# Patient Record
Sex: Female | Born: 1995 | Race: White | Hispanic: No | Marital: Married | Smoking: Never smoker
Health system: Southern US, Community
[De-identification: ages and names within clinical notes are randomized; demographics above are authoritative.]

## PROBLEM LIST (undated history)

## (undated) DIAGNOSIS — Z87442 Personal history of urinary calculi: Secondary | ICD-10-CM

## (undated) DIAGNOSIS — F419 Anxiety disorder, unspecified: Secondary | ICD-10-CM

## (undated) DIAGNOSIS — R519 Headache, unspecified: Secondary | ICD-10-CM

## (undated) DIAGNOSIS — I499 Cardiac arrhythmia, unspecified: Secondary | ICD-10-CM

## (undated) DIAGNOSIS — E7800 Pure hypercholesterolemia, unspecified: Secondary | ICD-10-CM

## (undated) DIAGNOSIS — J45909 Unspecified asthma, uncomplicated: Secondary | ICD-10-CM

## (undated) DIAGNOSIS — Z789 Other specified health status: Secondary | ICD-10-CM

## (undated) HISTORY — PX: ADENOIDECTOMY: SUR15

## (undated) HISTORY — PX: OTHER SURGICAL HISTORY: SHX169

---

## 2020-01-26 ENCOUNTER — Encounter (HOSPITAL_COMMUNITY): Admission: EM | Disposition: A | Discharge status: Home / Self Care | Payer: Self-pay | Source: Home / Self Care

## 2020-01-26 ENCOUNTER — Emergency Department (HOSPITAL_COMMUNITY): Payer: Medicaid Other

## 2020-01-26 ENCOUNTER — Inpatient Hospital Stay (HOSPITAL_COMMUNITY): Payer: Medicaid Other

## 2020-01-26 ENCOUNTER — Inpatient Hospital Stay (HOSPITAL_COMMUNITY)
Admission: EM | Admit: 2020-01-26 | Discharge: 2020-01-30 | DRG: 504 | Disposition: A | Discharge status: Home / Self Care | Payer: Medicaid Other | Attending: General Surgery | Admitting: General Surgery

## 2020-01-26 ENCOUNTER — Encounter (HOSPITAL_COMMUNITY): Payer: Self-pay

## 2020-01-26 ENCOUNTER — Inpatient Hospital Stay (HOSPITAL_COMMUNITY): Payer: Medicaid Other | Admitting: Certified Registered"

## 2020-01-26 DIAGNOSIS — S92009A Unspecified fracture of unspecified calcaneus, initial encounter for closed fracture: Secondary | ICD-10-CM | POA: Diagnosis present

## 2020-01-26 DIAGNOSIS — S01112A Laceration without foreign body of left eyelid and periocular area, initial encounter: Secondary | ICD-10-CM | POA: Diagnosis present

## 2020-01-26 DIAGNOSIS — R402142 Coma scale, eyes open, spontaneous, at arrival to emergency department: Secondary | ICD-10-CM | POA: Diagnosis present

## 2020-01-26 DIAGNOSIS — S0081XA Abrasion of other part of head, initial encounter: Secondary | ICD-10-CM | POA: Diagnosis present

## 2020-01-26 DIAGNOSIS — D62 Acute posthemorrhagic anemia: Secondary | ICD-10-CM | POA: Diagnosis not present

## 2020-01-26 DIAGNOSIS — S060X1A Concussion with loss of consciousness of 30 minutes or less, initial encounter: Secondary | ICD-10-CM | POA: Diagnosis present

## 2020-01-26 DIAGNOSIS — Z20822 Contact with and (suspected) exposure to covid-19: Secondary | ICD-10-CM | POA: Diagnosis present

## 2020-01-26 DIAGNOSIS — E7800 Pure hypercholesterolemia, unspecified: Secondary | ICD-10-CM | POA: Diagnosis present

## 2020-01-26 DIAGNOSIS — R402242 Coma scale, best verbal response, confused conversation, at arrival to emergency department: Secondary | ICD-10-CM | POA: Diagnosis present

## 2020-01-26 DIAGNOSIS — R413 Other amnesia: Secondary | ICD-10-CM | POA: Diagnosis present

## 2020-01-26 DIAGNOSIS — S92061B Displaced intraarticular fracture of right calcaneus, initial encounter for open fracture: Secondary | ICD-10-CM | POA: Diagnosis present

## 2020-01-26 DIAGNOSIS — F17290 Nicotine dependence, other tobacco product, uncomplicated: Secondary | ICD-10-CM | POA: Diagnosis present

## 2020-01-26 DIAGNOSIS — T148XXA Other injury of unspecified body region, initial encounter: Secondary | ICD-10-CM

## 2020-01-26 DIAGNOSIS — Z419 Encounter for procedure for purposes other than remedying health state, unspecified: Secondary | ICD-10-CM

## 2020-01-26 DIAGNOSIS — S92121B Displaced fracture of body of right talus, initial encounter for open fracture: Secondary | ICD-10-CM | POA: Diagnosis present

## 2020-01-26 DIAGNOSIS — R402362 Coma scale, best motor response, obeys commands, at arrival to emergency department: Secondary | ICD-10-CM | POA: Diagnosis present

## 2020-01-26 DIAGNOSIS — Z23 Encounter for immunization: Secondary | ICD-10-CM

## 2020-01-26 DIAGNOSIS — S92012B Displaced fracture of body of left calcaneus, initial encounter for open fracture: Secondary | ICD-10-CM

## 2020-01-26 DIAGNOSIS — Y92410 Unspecified street and highway as the place of occurrence of the external cause: Secondary | ICD-10-CM | POA: Diagnosis not present

## 2020-01-26 HISTORY — PX: ORIF CALCANEOUS FRACTURE: SHX5030

## 2020-01-26 HISTORY — PX: I & D EXTREMITY: SHX5045

## 2020-01-26 HISTORY — DX: Pure hypercholesterolemia, unspecified: E78.00

## 2020-01-26 HISTORY — DX: Other specified health status: Z78.9

## 2020-01-26 LAB — COMPREHENSIVE METABOLIC PANEL
ALT: 23 U/L (ref 0–44)
AST: 23 U/L (ref 15–41)
Albumin: 3.9 g/dL (ref 3.5–5.0)
Alkaline Phosphatase: 69 U/L (ref 38–126)
Anion gap: 13 (ref 5–15)
BUN: 9 mg/dL (ref 6–20)
CO2: 22 mmol/L (ref 22–32)
Calcium: 8.5 mg/dL — ABNORMAL LOW (ref 8.9–10.3)
Chloride: 103 mmol/L (ref 98–111)
Creatinine, Ser: 0.83 mg/dL (ref 0.44–1.00)
GFR calc Af Amer: >60 mL/min (ref >60)
GFR calc non Af Amer: >60 mL/min (ref >60)
Glucose, Bld: 163 mg/dL — ABNORMAL HIGH (ref 70–99)
Potassium: 3 mmol/L — ABNORMAL LOW (ref 3.5–5.1)
Sodium: 138 mmol/L (ref 135–145)
Total Bilirubin: 0.3 mg/dL (ref 0.3–1.2)
Total Protein: 6.9 g/dL (ref 6.5–8.1)

## 2020-01-26 LAB — I-STAT CHEM 8, ED
BUN: 8 mg/dL (ref 6–20)
Calcium, Ion: 1.13 mmol/L — ABNORMAL LOW (ref 1.15–1.40)
Chloride: 105 mmol/L (ref 98–111)
Creatinine, Ser: 0.6 mg/dL (ref 0.44–1.00)
Glucose, Bld: 159 mg/dL — ABNORMAL HIGH (ref 70–99)
HCT: 40 % (ref 36.0–46.0)
Hemoglobin: 13.6 g/dL (ref 12.0–15.0)
Potassium: 3 mmol/L — ABNORMAL LOW (ref 3.5–5.1)
Sodium: 141 mmol/L (ref 135–145)
TCO2: 23 mmol/L (ref 22–32)

## 2020-01-26 LAB — URINALYSIS, ROUTINE W REFLEX MICROSCOPIC
Bilirubin Urine: NEGATIVE
Glucose, UA: NEGATIVE mg/dL
Ketones, ur: NEGATIVE mg/dL
Nitrite: NEGATIVE
Protein, ur: NEGATIVE mg/dL
RBC / HPF: >50 RBC/hpf — ABNORMAL HIGH (ref 0–5)
Specific Gravity, Urine: 1.02 (ref 1.005–1.030)
pH: 6 (ref 5.0–8.0)

## 2020-01-26 LAB — SAMPLE TO BLOOD BANK

## 2020-01-26 LAB — CBC
HCT: 40.6 % (ref 36.0–46.0)
Hemoglobin: 13.5 g/dL (ref 12.0–15.0)
MCH: 30.9 pg (ref 26.0–34.0)
MCHC: 33.3 g/dL (ref 30.0–36.0)
MCV: 92.9 fL (ref 80.0–100.0)
Platelets: 328 10*3/uL (ref 150–400)
RBC: 4.37 MIL/uL (ref 3.87–5.11)
RDW: 13 % (ref 11.5–15.5)
WBC: 15.4 10*3/uL — ABNORMAL HIGH (ref 4.0–10.5)
nRBC: 0 % (ref 0.0–0.2)

## 2020-01-26 LAB — ETHANOL: Alcohol, Ethyl (B): <10 mg/dL (ref <10)

## 2020-01-26 LAB — RESPIRATORY PANEL BY RT PCR (FLU A&B, COVID)
Influenza A by PCR: NEGATIVE
Influenza B by PCR: NEGATIVE
SARS Coronavirus 2 by RT PCR: NEGATIVE

## 2020-01-26 LAB — I-STAT BETA HCG BLOOD, ED (MC, WL, AP ONLY): I-stat hCG, quantitative: <5 m[IU]/mL (ref <5)

## 2020-01-26 LAB — PROTIME-INR
INR: 0.9 (ref 0.8–1.2)
Prothrombin Time: 12.4 seconds (ref 11.4–15.2)

## 2020-01-26 LAB — CDS SEROLOGY

## 2020-01-26 LAB — LACTIC ACID, PLASMA: Lactic Acid, Venous: 2.9 mmol/L (ref 0.5–1.9)

## 2020-01-26 SURGERY — OPEN REDUCTION INTERNAL FIXATION (ORIF) CALCANEOUS FRACTURE
Anesthesia: General | Site: Foot | Laterality: Right

## 2020-01-26 MED ORDER — HYDROMORPHONE HCL 1 MG/ML IJ SOLN
0.2500 mg | INTRAMUSCULAR | Status: DC | PRN
  Administered 2020-01-26 (×3): 0.5 mg via INTRAVENOUS

## 2020-01-26 MED ORDER — FENTANYL CITRATE (PF) 100 MCG/2ML IJ SOLN
50.0000 ug | Freq: Once | INTRAMUSCULAR | Status: AC
  Administered 2020-01-26: 10:00:00 50 ug via INTRAVENOUS
  Filled 2020-01-26: qty 2

## 2020-01-26 MED ORDER — HYDROMORPHONE HCL 1 MG/ML IJ SOLN
INTRAMUSCULAR | Status: AC
  Filled 2020-01-26: qty 1

## 2020-01-26 MED ORDER — ROCURONIUM BROMIDE 10 MG/ML (PF) SYRINGE
PREFILLED_SYRINGE | INTRAVENOUS | Status: AC
  Filled 2020-01-26: qty 10

## 2020-01-26 MED ORDER — FENTANYL CITRATE (PF) 250 MCG/5ML IJ SOLN
INTRAMUSCULAR | Status: AC
  Filled 2020-01-26: qty 5

## 2020-01-26 MED ORDER — FENTANYL CITRATE (PF) 250 MCG/5ML IJ SOLN
INTRAMUSCULAR | Status: DC | PRN
  Administered 2020-01-26 (×2): 50 ug via INTRAVENOUS
  Administered 2020-01-26: 100 ug via INTRAVENOUS
  Administered 2020-01-26: 50 ug via INTRAVENOUS

## 2020-01-26 MED ORDER — DOCUSATE SODIUM 100 MG PO CAPS
100.0000 mg | ORAL_CAPSULE | Freq: Two times a day (BID) | ORAL | Status: DC
  Administered 2020-01-26 – 2020-01-30 (×8): 100 mg via ORAL
  Filled 2020-01-26 (×7): qty 1

## 2020-01-26 MED ORDER — CEFAZOLIN SODIUM-DEXTROSE 2-4 GM/100ML-% IV SOLN
2.0000 g | Freq: Once | INTRAVENOUS | Status: AC
  Administered 2020-01-26: 2 g via INTRAVENOUS
  Filled 2020-01-26: qty 100

## 2020-01-26 MED ORDER — TETRACAINE HCL 0.5 % OP SOLN
2.0000 [drp] | Freq: Once | OPHTHALMIC | Status: AC
  Administered 2020-01-26: 11:00:00 2 [drp] via OPHTHALMIC
  Filled 2020-01-26: qty 8

## 2020-01-26 MED ORDER — MIDAZOLAM HCL 2 MG/2ML IJ SOLN
INTRAMUSCULAR | Status: AC
  Filled 2020-01-26: qty 2

## 2020-01-26 MED ORDER — SUGAMMADEX SODIUM 200 MG/2ML IV SOLN
INTRAVENOUS | Status: DC | PRN
  Administered 2020-01-26: 200 mg via INTRAVENOUS

## 2020-01-26 MED ORDER — 0.9 % SODIUM CHLORIDE (POUR BTL) OPTIME
TOPICAL | Status: DC | PRN
  Administered 2020-01-26: 1000 mL

## 2020-01-26 MED ORDER — GABAPENTIN 300 MG PO CAPS
300.0000 mg | ORAL_CAPSULE | Freq: Three times a day (TID) | ORAL | Status: DC
  Administered 2020-01-26 – 2020-01-28 (×5): 300 mg via ORAL
  Filled 2020-01-26 (×5): qty 1

## 2020-01-26 MED ORDER — HYDROMORPHONE HCL 1 MG/ML IJ SOLN
INTRAMUSCULAR | Status: DC | PRN
  Administered 2020-01-26: .5 mg via INTRAVENOUS

## 2020-01-26 MED ORDER — POVIDONE-IODINE 10 % EX SWAB: 2.0000 "application " | Freq: Once | CUTANEOUS | Status: DC

## 2020-01-26 MED ORDER — PHENYLEPHRINE 40 MCG/ML (10ML) SYRINGE FOR IV PUSH (FOR BLOOD PRESSURE SUPPORT)
PREFILLED_SYRINGE | INTRAVENOUS | Status: AC
  Filled 2020-01-26: qty 20

## 2020-01-26 MED ORDER — MIDAZOLAM HCL 5 MG/5ML IJ SOLN
INTRAMUSCULAR | Status: DC | PRN
  Administered 2020-01-26: 2 mg via INTRAVENOUS

## 2020-01-26 MED ORDER — VANCOMYCIN HCL 1000 MG IV SOLR
INTRAVENOUS | Status: DC | PRN
  Administered 2020-01-26: 1000 mg via TOPICAL

## 2020-01-26 MED ORDER — SODIUM CHLORIDE 0.9 % IV SOLN
2.0000 g | INTRAVENOUS | Status: AC
  Administered 2020-01-26 – 2020-01-28 (×3): 2 g via INTRAVENOUS
  Filled 2020-01-26 (×3): qty 20

## 2020-01-26 MED ORDER — FENTANYL CITRATE (PF) 100 MCG/2ML IJ SOLN
50.0000 ug | Freq: Once | INTRAMUSCULAR | Status: AC
  Administered 2020-01-26: 50 ug via INTRAVENOUS
  Filled 2020-01-26: qty 2

## 2020-01-26 MED ORDER — CHLORHEXIDINE GLUCONATE 4 % EX LIQD
60.0000 mL | Freq: Once | CUTANEOUS | Status: DC
  Filled 2020-01-26: qty 60

## 2020-01-26 MED ORDER — SODIUM CHLORIDE 0.9 % IR SOLN
Status: DC | PRN
  Administered 2020-01-26 (×3): 3000 mL

## 2020-01-26 MED ORDER — PROPOFOL 10 MG/ML IV BOLUS
INTRAVENOUS | Status: DC | PRN
  Administered 2020-01-26: 200 mg via INTRAVENOUS

## 2020-01-26 MED ORDER — LACTATED RINGERS IV SOLN: INTRAVENOUS | Status: DC

## 2020-01-26 MED ORDER — CEFAZOLIN SODIUM-DEXTROSE 2-4 GM/100ML-% IV SOLN
2.0000 g | INTRAVENOUS | Status: AC
  Administered 2020-01-26: 2 g via INTRAVENOUS
  Filled 2020-01-26: qty 100

## 2020-01-26 MED ORDER — LIDOCAINE 2% (20 MG/ML) 5 ML SYRINGE
INTRAMUSCULAR | Status: AC
  Filled 2020-01-26: qty 5

## 2020-01-26 MED ORDER — ACETAMINOPHEN 500 MG PO TABS: 1000.0000 mg | ORAL_TABLET | Freq: Once | ORAL | Status: AC

## 2020-01-26 MED ORDER — ACETAMINOPHEN 500 MG PO TABS
ORAL_TABLET | ORAL | Status: AC
  Administered 2020-01-26: 1000 mg via ORAL
  Filled 2020-01-26: qty 2

## 2020-01-26 MED ORDER — METHOCARBAMOL 500 MG PO TABS
1000.0000 mg | ORAL_TABLET | Freq: Three times a day (TID) | ORAL | Status: DC
  Administered 2020-01-26 – 2020-01-30 (×11): 1000 mg via ORAL
  Filled 2020-01-26 (×12): qty 2

## 2020-01-26 MED ORDER — TOBRAMYCIN SULFATE 1.2 G IJ SOLR
INTRAMUSCULAR | Status: DC | PRN
  Administered 2020-01-26: 1.2 g

## 2020-01-26 MED ORDER — HYDROMORPHONE HCL 1 MG/ML IJ SOLN
INTRAMUSCULAR | Status: AC
  Filled 2020-01-26: qty 0.5

## 2020-01-26 MED ORDER — ENOXAPARIN SODIUM 40 MG/0.4ML ~~LOC~~ SOLN
40.0000 mg | SUBCUTANEOUS | Status: DC
  Administered 2020-01-27 – 2020-01-30 (×4): 40 mg via SUBCUTANEOUS
  Filled 2020-01-26 (×4): qty 0.4

## 2020-01-26 MED ORDER — LIDOCAINE 2% (20 MG/ML) 5 ML SYRINGE
INTRAMUSCULAR | Status: DC | PRN
  Administered 2020-01-26: 60 mg via INTRAVENOUS

## 2020-01-26 MED ORDER — ENOXAPARIN SODIUM 30 MG/0.3ML ~~LOC~~ SOLN: 30.0000 mg | Freq: Two times a day (BID) | SUBCUTANEOUS | Status: DC

## 2020-01-26 MED ORDER — PROPOFOL 10 MG/ML IV BOLUS
INTRAVENOUS | Status: AC
  Filled 2020-01-26: qty 20

## 2020-01-26 MED ORDER — DEXAMETHASONE SODIUM PHOSPHATE 10 MG/ML IJ SOLN
INTRAMUSCULAR | Status: DC | PRN
  Administered 2020-01-26: 5 mg via INTRAVENOUS

## 2020-01-26 MED ORDER — ONDANSETRON 4 MG PO TBDP: 4.0000 mg | ORAL_TABLET | Freq: Four times a day (QID) | ORAL | Status: DC | PRN

## 2020-01-26 MED ORDER — ASCORBIC ACID 500 MG PO TABS
500.0000 mg | ORAL_TABLET | Freq: Every day | ORAL | Status: DC
  Administered 2020-01-26 – 2020-01-30 (×5): 500 mg via ORAL
  Filled 2020-01-26 (×5): qty 1

## 2020-01-26 MED ORDER — PHENYLEPHRINE HCL (PRESSORS) 10 MG/ML IV SOLN
INTRAVENOUS | Status: AC
  Filled 2020-01-26: qty 1

## 2020-01-26 MED ORDER — VANCOMYCIN HCL 1000 MG IV SOLR
INTRAVENOUS | Status: AC
  Filled 2020-01-26: qty 1000

## 2020-01-26 MED ORDER — DEXAMETHASONE SODIUM PHOSPHATE 10 MG/ML IJ SOLN
INTRAMUSCULAR | Status: AC
  Filled 2020-01-26: qty 1

## 2020-01-26 MED ORDER — IOHEXOL 300 MG/ML  SOLN
100.0000 mL | Freq: Once | INTRAMUSCULAR | Status: AC | PRN
  Administered 2020-01-26: 12:00:00 100 mL via INTRAVENOUS

## 2020-01-26 MED ORDER — OXYCODONE HCL 5 MG/5ML PO SOLN
5.0000 mg | ORAL | Status: DC | PRN
  Administered 2020-01-26 – 2020-01-27 (×5): 10 mg via ORAL
  Filled 2020-01-26 (×5): qty 10

## 2020-01-26 MED ORDER — ONDANSETRON HCL 4 MG/2ML IJ SOLN
4.0000 mg | Freq: Four times a day (QID) | INTRAMUSCULAR | Status: DC | PRN
  Administered 2020-01-26 – 2020-01-30 (×2): 4 mg via INTRAVENOUS
  Filled 2020-01-26 (×2): qty 2

## 2020-01-26 MED ORDER — FLUORESCEIN SODIUM 1 MG OP STRP
1.0000 | ORAL_STRIP | Freq: Once | OPHTHALMIC | Status: AC
  Administered 2020-01-26: 1 via OPHTHALMIC
  Filled 2020-01-26: qty 1

## 2020-01-26 MED ORDER — ONDANSETRON HCL 4 MG/2ML IJ SOLN
4.0000 mg | Freq: Once | INTRAMUSCULAR | Status: AC
  Administered 2020-01-26: 10:00:00 4 mg via INTRAVENOUS
  Filled 2020-01-26: qty 2

## 2020-01-26 MED ORDER — TOBRAMYCIN SULFATE 1.2 G IJ SOLR
INTRAMUSCULAR | Status: AC
  Filled 2020-01-26: qty 1.2

## 2020-01-26 MED ORDER — MORPHINE SULFATE (PF) 2 MG/ML IV SOLN
2.0000 mg | INTRAVENOUS | Status: DC | PRN
  Administered 2020-01-26 – 2020-01-27 (×2): 2 mg via INTRAVENOUS
  Filled 2020-01-26 (×2): qty 1

## 2020-01-26 MED ORDER — TETANUS-DIPHTH-ACELL PERTUSSIS 5-2.5-18.5 LF-MCG/0.5 IM SUSP
0.5000 mL | Freq: Once | INTRAMUSCULAR | Status: AC
  Administered 2020-01-26: 10:00:00 0.5 mL via INTRAMUSCULAR
  Filled 2020-01-26: qty 0.5

## 2020-01-26 MED ORDER — ACETAMINOPHEN 500 MG PO TABS
1000.0000 mg | ORAL_TABLET | Freq: Four times a day (QID) | ORAL | Status: DC
  Administered 2020-01-26 – 2020-01-30 (×14): 1000 mg via ORAL
  Filled 2020-01-26 (×14): qty 2

## 2020-01-26 MED ORDER — ROCURONIUM BROMIDE 50 MG/5ML IV SOSY
PREFILLED_SYRINGE | INTRAVENOUS | Status: DC | PRN
  Administered 2020-01-26: 80 mg via INTRAVENOUS

## 2020-01-26 MED ORDER — MORPHINE SULFATE (PF) 2 MG/ML IV SOLN: 2.0000 mg | INTRAVENOUS | Status: DC | PRN

## 2020-01-26 MED ORDER — ONDANSETRON HCL 4 MG/2ML IJ SOLN
INTRAMUSCULAR | Status: AC
  Filled 2020-01-26: qty 2

## 2020-01-26 MED ORDER — SODIUM CHLORIDE 0.9 % IV BOLUS
125.0000 mL | Freq: Once | INTRAVENOUS | Status: AC
  Administered 2020-01-26: 125 mL via INTRAVENOUS

## 2020-01-26 SURGICAL SUPPLY — 69 items
BIT DRILL 4.8X200 CANN (BIT) ×3 IMPLANT
BNDG COHESIVE 4X5 TAN STRL (GAUZE/BANDAGES/DRESSINGS) ×3 IMPLANT
BNDG ELASTIC 4X5.8 VLCR STR LF (GAUZE/BANDAGES/DRESSINGS) ×3 IMPLANT
BNDG ELASTIC 6X10 VLCR STRL LF (GAUZE/BANDAGES/DRESSINGS) ×3 IMPLANT
BNDG ELASTIC 6X5.8 VLCR STR LF (GAUZE/BANDAGES/DRESSINGS) ×3 IMPLANT
BNDG GAUZE ELAST 4 BULKY (GAUZE/BANDAGES/DRESSINGS) ×6 IMPLANT
BRUSH SCRUB EZ PLAIN DRY (MISCELLANEOUS) ×9 IMPLANT
CANISTER WOUNDNEG PRESSURE 500 (CANNISTER) ×3 IMPLANT
CHLORAPREP W/TINT 26 (MISCELLANEOUS) ×6 IMPLANT
COVER MAYO STAND STRL (DRAPES) IMPLANT
COVER SURGICAL LIGHT HANDLE (MISCELLANEOUS) ×6 IMPLANT
COVER WAND RF STERILE (DRAPES) ×3 IMPLANT
DRAPE C-ARM 42X72 X-RAY (DRAPES) ×3 IMPLANT
DRAPE C-ARMOR (DRAPES) ×3 IMPLANT
DRAPE IMP U-DRAPE 54X76 (DRAPES) ×6 IMPLANT
DRAPE ORTHO SPLIT 77X108 STRL (DRAPES) ×1
DRAPE SURG 17X23 STRL (DRAPES) ×3 IMPLANT
DRAPE SURG ORHT 6 SPLT 77X108 (DRAPES) ×2 IMPLANT
DRAPE U-SHAPE 47X51 STRL (DRAPES) ×3 IMPLANT
DRSG ADAPTIC 3X8 NADH LF (GAUZE/BANDAGES/DRESSINGS) IMPLANT
DRSG MEPITEL 4X7.2 (GAUZE/BANDAGES/DRESSINGS) IMPLANT
ELECT REM PT RETURN 9FT ADLT (ELECTROSURGICAL) ×3
ELECTRODE REM PT RTRN 9FT ADLT (ELECTROSURGICAL) ×2 IMPLANT
EVACUATOR 1/8 PVC DRAIN (DRAIN) IMPLANT
GAUZE SPONGE 4X4 12PLY STRL (GAUZE/BANDAGES/DRESSINGS) ×3 IMPLANT
GLOVE BIO SURGEON STRL SZ 6.5 (GLOVE) ×9 IMPLANT
GLOVE BIO SURGEON STRL SZ7.5 (GLOVE) ×30 IMPLANT
GLOVE BIOGEL M 6.5 STRL (GLOVE) ×15 IMPLANT
GLOVE BIOGEL PI IND STRL 6.5 (GLOVE) ×2 IMPLANT
GLOVE BIOGEL PI IND STRL 7.5 (GLOVE) ×2 IMPLANT
GLOVE BIOGEL PI IND STRL 8 (GLOVE) ×4 IMPLANT
GLOVE BIOGEL PI INDICATOR 6.5 (GLOVE) ×1
GLOVE BIOGEL PI INDICATOR 7.5 (GLOVE) ×1
GLOVE BIOGEL PI INDICATOR 8 (GLOVE) ×2
GOWN STRL REUS W/ TWL LRG LVL3 (GOWN DISPOSABLE) ×4 IMPLANT
GOWN STRL REUS W/TWL LRG LVL3 (GOWN DISPOSABLE) ×2
HANDPIECE INTERPULSE COAX TIP (DISPOSABLE) ×1
KIT BASIN OR (CUSTOM PROCEDURE TRAY) ×3 IMPLANT
KIT PREVENA INCISION MGT 13 (CANNISTER) ×3 IMPLANT
KIT TURNOVER KIT B (KITS) ×3 IMPLANT
MANIFOLD NEPTUNE II (INSTRUMENTS) ×3 IMPLANT
NEEDLE 22X1 1/2 (OR ONLY) (NEEDLE) IMPLANT
NS IRRIG 1000ML POUR BTL (IV SOLUTION) ×3 IMPLANT
PACK ORTHO EXTREMITY (CUSTOM PROCEDURE TRAY) ×3 IMPLANT
PAD ARMBOARD 7.5X6 YLW CONV (MISCELLANEOUS) ×6 IMPLANT
PAD CAST 4YDX4 CTTN HI CHSV (CAST SUPPLIES) ×2 IMPLANT
PADDING CAST COTTON 4X4 STRL (CAST SUPPLIES) ×1
PADDING CAST COTTON 6X4 STRL (CAST SUPPLIES) ×3 IMPLANT
PIN GUIDE DRILL TIP 2.8X300 (DRILL) ×6 IMPLANT
SCREW CANN  FULL THD 6.5X75 (Screw) ×1 IMPLANT
SCREW CANN FT 70X6.5 NS (Screw) ×2 IMPLANT
SCREW CANN FULL THD 6.5X75 (Screw) ×2 IMPLANT
SCREW CANNULATED 6.5X70MM (Screw) ×1 IMPLANT
SET HNDPC FAN SPRY TIP SCT (DISPOSABLE) ×2 IMPLANT
SPONGE LAP 18X18 RF (DISPOSABLE) ×3 IMPLANT
STAPLER VISISTAT 35W (STAPLE) ×3 IMPLANT
STRIP CLOSURE SKIN 1/2X4 (GAUZE/BANDAGES/DRESSINGS) IMPLANT
SUT ETHILON 2 0 FS 18 (SUTURE) ×3 IMPLANT
SUT ETHILON 3 0 PS 1 (SUTURE) ×6 IMPLANT
SUT MON AB 2-0 CT1 36 (SUTURE) ×3 IMPLANT
SUT PDS AB 0 CT 36 (SUTURE) IMPLANT
SUT PROLENE 0 CT (SUTURE) IMPLANT
SWAB CULTURE ESWAB REG 1ML (MISCELLANEOUS) IMPLANT
TOWEL GREEN STERILE (TOWEL DISPOSABLE) ×6 IMPLANT
TOWEL GREEN STERILE FF (TOWEL DISPOSABLE) ×6 IMPLANT
TUBE CONNECTING 12X1/4 (SUCTIONS) ×3 IMPLANT
UNDERPAD 30X30 (UNDERPADS AND DIAPERS) ×3 IMPLANT
WATER STERILE IRR 1000ML POUR (IV SOLUTION) ×3 IMPLANT
YANKAUER SUCT BULB TIP NO VENT (SUCTIONS) ×6 IMPLANT

## 2020-01-26 NOTE — ED Notes (Signed)
Mother, Herbert Seta, to be updated. Phone number [phone].
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
## 2020-01-26 NOTE — Transfer of Care (Signed)
Immediate Anesthesia Transfer of Care Note  Patient: Stephanie Dickson  Procedure(s) Performed: IRRIGATION AND DEBRIDEMENT EXTREMITY APPLICATION OF WOUND VAC (Right Foot) ORIF  CALCANEUS (Right )  Patient Location: PACU  Anesthesia Type:General  Level of Consciousness: drowsy and patient cooperative  Airway & Oxygen Therapy: Patient Spontanous Breathing  Post-op Assessment: Report given to RN and Post -op Vital signs reviewed and stable  Post vital signs: Reviewed and stable  Last Vitals:  Vitals Value Taken Time  BP 118/74 01/26/20 1816  Temp 37 C 01/26/20 1746  Pulse 104 01/26/20 1822  Resp 21 01/26/20 1822  SpO2 96 % 01/26/20 1822  Vitals shown include unvalidated device data.  Last Pain:  Vitals:   01/26/20 1821  TempSrc:   PainSc: 5          Complications: No apparent anesthesia complications

## 2020-01-26 NOTE — Progress Notes (Signed)
Pt up to bedside commode

## 2020-01-26 NOTE — Anesthesia Procedure Notes (Signed)
Procedure Name: Intubation Date/Time: 01/26/2020 3:30 PM Performed by: Tillman Abide, CRNA Pre-anesthesia Checklist: Patient identified, Emergency Drugs available, Suction available and Patient being monitored Patient Re-evaluated:Patient Re-evaluated prior to induction Oxygen Delivery Method: Circle System Utilized Preoxygenation: Pre-oxygenation with 100% oxygen Induction Type: IV induction Ventilation: Mask ventilation without difficulty Laryngoscope Size: Miller and 2 Grade View: Grade I Tube type: Oral Number of attempts: 1 Airway Equipment and Method: Stylet and Oral airway Placement Confirmation: ETT inserted through vocal cords under direct vision,  positive ETCO2 and breath sounds checked- equal and bilateral Secured at: 22 cm Tube secured with: Tape Dental Injury: Teeth and Oropharynx as per pre-operative assessment
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
## 2020-01-26 NOTE — ED Provider Notes (Signed)
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  Tdap (BOOSTRIX) injection 0.5 mL (0.5 mLs Intramuscular Given 01/26/20 1010)  fluorescein ophthalmic strip 1 strip (1 strip Left Eye Given by Other 01/26/20 1047)  tetracaine (PONTOCAINE) 0.5 % ophthalmic solution 2 drop (2 drops Left Eye Given by Other 01/26/20 1047)  fentaNYL (SUBLIMAZE) injection 50 mcg (50 mcg Intravenous Given 01/26/20 1137)  iohexol (OMNIPAQUE) 300 MG/ML solution 100 mL (100 mLs Intravenous Contrast Given 01/26/20 1140)    ED Course  I have reviewed the triage vital signs and the nursing notes.  Pertinent labs & imaging results that were available during my care of the patient were reviewed by me and considered in my medical decision making (see chart for details).    MDM Rules/Calculators/A&P                      Pt d/w PA Jeffery who spoke with Dr. Jena GaussHaddix.  Pt will be taken to the OR for debridement and repair.  Pt given tetanus and 2 g ancef.  Pt d/w trauma who will admit.   Final Clinical Impression(s) / ED Diagnoses Final diagnoses:  Open displaced fracture of body of left calcaneus, initial encounter  Left eyelid laceration, initial encounter  Concussion  with loss of consciousness of 30 minutes or less, initial encounter    Rx / DC Orders ED Discharge Orders    None       Jacalyn LefevreHaviland, Xaviera Flaten, MD 01/26/20 1257

## 2020-01-26 NOTE — Anesthesia Preprocedure Evaluation (Addendum)
Anesthesia Evaluation  Patient identified by MRN, date of birth, ID band Patient awake    Reviewed: Allergy & Precautions, H&P , NPO status , Patient's Chart, lab work & pertinent test results  Airway Mallampati: III  TM Distance: >3 FB Neck ROM: Full    Dental no notable dental hx. (+) Teeth Intact, Dental Advisory Given   Pulmonary neg pulmonary ROS,    Pulmonary exam normal breath sounds clear to auscultation       Cardiovascular negative cardio ROS   Rhythm:Regular Rate:Normal     Neuro/Psych negative neurological ROS  negative psych ROS   GI/Hepatic negative GI ROS, Neg liver ROS,   Endo/Other  negative endocrine ROS  Renal/GU negative Renal ROS  negative genitourinary   Musculoskeletal   Abdominal   Peds  Hematology negative hematology ROS (+)   Anesthesia Other Findings   Reproductive/Obstetrics negative OB ROS                            Anesthesia Physical Anesthesia Plan  ASA: II  Anesthesia Plan: General   Post-op Pain Management:    Induction: Intravenous, Rapid sequence and Cricoid pressure planned  PONV Risk Score and Plan: 4 or greater and Ondansetron, Dexamethasone and Midazolam  Airway Management Planned: Oral ETT  Additional Equipment:   Intra-op Plan:   Post-operative Plan: Extubation in OR  Informed Consent: I have reviewed the patients History and Physical, chart, labs and discussed the procedure including the risks, benefits and alternatives for the proposed anesthesia with the patient or authorized representative who has indicated his/her understanding and acceptance.     Dental advisory given  Plan Discussed with: CRNA  Anesthesia Plan Comments:        Anesthesia Quick Evaluation
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
## 2020-01-26 NOTE — Progress Notes (Signed)
Pt arrived to the unit. Pt not in distress and tolerated well. 

## 2020-01-27 ENCOUNTER — Other Ambulatory Visit: Payer: Self-pay

## 2020-01-27 ENCOUNTER — Encounter (HOSPITAL_COMMUNITY): Payer: Self-pay

## 2020-01-27 LAB — BASIC METABOLIC PANEL
Anion gap: 11 (ref 5–15)
BUN: <5 mg/dL — ABNORMAL LOW (ref 6–20)
CO2: 22 mmol/L (ref 22–32)
Calcium: 8.8 mg/dL — ABNORMAL LOW (ref 8.9–10.3)
Chloride: 106 mmol/L (ref 98–111)
Creatinine, Ser: 0.77 mg/dL (ref 0.44–1.00)
GFR calc Af Amer: >60 mL/min (ref >60)
GFR calc non Af Amer: >60 mL/min (ref >60)
Glucose, Bld: 178 mg/dL — ABNORMAL HIGH (ref 70–99)
Potassium: 3.6 mmol/L (ref 3.5–5.1)
Sodium: 139 mmol/L (ref 135–145)

## 2020-01-27 LAB — CBC
HCT: 34.8 % — ABNORMAL LOW (ref 36.0–46.0)
Hemoglobin: 11.6 g/dL — ABNORMAL LOW (ref 12.0–15.0)
MCH: 30.6 pg (ref 26.0–34.0)
MCHC: 33.3 g/dL (ref 30.0–36.0)
MCV: 91.8 fL (ref 80.0–100.0)
Platelets: 272 10*3/uL (ref 150–400)
RBC: 3.79 MIL/uL — ABNORMAL LOW (ref 3.87–5.11)
RDW: 13.2 % (ref 11.5–15.5)
WBC: 17.1 10*3/uL — ABNORMAL HIGH (ref 4.0–10.5)
nRBC: 0 % (ref 0.0–0.2)

## 2020-01-27 LAB — HIV ANTIBODY (ROUTINE TESTING W REFLEX): HIV Screen 4th Generation wRfx: NONREACTIVE

## 2020-01-27 LAB — VITAMIN D 25 HYDROXY (VIT D DEFICIENCY, FRACTURES): Vit D, 25-Hydroxy: 12.71 ng/mL — ABNORMAL LOW (ref 30–100)

## 2020-01-27 MED ORDER — POLYVINYL ALCOHOL 1.4 % OP SOLN
2.0000 [drp] | OPHTHALMIC | Status: DC | PRN
  Filled 2020-01-27: qty 15

## 2020-01-27 MED ORDER — POLYVINYL ALCOHOL 1.4 % OP SOLN
1.0000 [drp] | OPHTHALMIC | Status: DC | PRN
  Filled 2020-01-27: qty 15

## 2020-01-27 MED ORDER — EYE WASH OPHTH SOLN
2.0000 [drp] | OPHTHALMIC | Status: DC | PRN
  Filled 2020-01-27 (×2): qty 118

## 2020-01-27 MED ORDER — OXYCODONE HCL 5 MG PO TABS
5.0000 mg | ORAL_TABLET | ORAL | Status: DC | PRN
  Administered 2020-01-28 – 2020-01-30 (×11): 10 mg via ORAL
  Filled 2020-01-27 (×12): qty 2

## 2020-01-27 NOTE — Anesthesia Postprocedure Evaluation (Signed)
Anesthesia Post Note  Patient: Stephanie Dickson  Procedure(s) Performed: IRRIGATION AND DEBRIDEMENT EXTREMITY APPLICATION OF WOUND VAC (Right Foot) ORIF  CALCANEUS (Right )     Patient location during evaluation: PACU Anesthesia Type: General Level of consciousness: awake and alert Pain management: pain level controlled Vital Signs Assessment: post-procedure vital signs reviewed and stable Respiratory status: spontaneous breathing, nonlabored ventilation, respiratory function stable and patient connected to nasal cannula oxygen Cardiovascular status: blood pressure returned to baseline and stable Postop Assessment: no apparent nausea or vomiting Anesthetic complications: no    Last Vitals:  Vitals:   01/27/20 0722 01/27/20 0800  BP: 113/63   Pulse: 91   Resp: 17   Temp: 36.8 C   SpO2: 98% 97%                  Shelton Silvas
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
## 2020-01-27 NOTE — Progress Notes (Signed)
Messaged on call regarding rapid heart rhythm- while pt at rest- pain is being managed- she report periodic sob when it happens
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
## 2020-01-28 LAB — CBC
HCT: 33.2 % — ABNORMAL LOW (ref 36.0–46.0)
Hemoglobin: 10.9 g/dL — ABNORMAL LOW (ref 12.0–15.0)
MCH: 30.4 pg (ref 26.0–34.0)
MCHC: 32.8 g/dL (ref 30.0–36.0)
MCV: 92.5 fL (ref 80.0–100.0)
Platelets: 244 10*3/uL (ref 150–400)
RBC: 3.59 MIL/uL — ABNORMAL LOW (ref 3.87–5.11)
RDW: 13.5 % (ref 11.5–15.5)
WBC: 12.3 10*3/uL — ABNORMAL HIGH (ref 4.0–10.5)
nRBC: 0 % (ref 0.0–0.2)

## 2020-01-28 LAB — BASIC METABOLIC PANEL
Anion gap: 11 (ref 5–15)
BUN: <5 mg/dL — ABNORMAL LOW (ref 6–20)
CO2: 28 mmol/L (ref 22–32)
Calcium: 8.5 mg/dL — ABNORMAL LOW (ref 8.9–10.3)
Chloride: 103 mmol/L (ref 98–111)
Creatinine, Ser: 0.75 mg/dL (ref 0.44–1.00)
GFR calc Af Amer: >60 mL/min (ref >60)
GFR calc non Af Amer: >60 mL/min (ref >60)
Glucose, Bld: 136 mg/dL — ABNORMAL HIGH (ref 70–99)
Potassium: 3.2 mmol/L — ABNORMAL LOW (ref 3.5–5.1)
Sodium: 142 mmol/L (ref 135–145)

## 2020-01-28 LAB — MAGNESIUM: Magnesium: 2.1 mg/dL (ref 1.7–2.4)

## 2020-01-28 LAB — PHOSPHORUS: Phosphorus: 3 mg/dL (ref 2.5–4.6)

## 2020-01-28 MED ORDER — GABAPENTIN 400 MG PO CAPS
400.0000 mg | ORAL_CAPSULE | Freq: Three times a day (TID) | ORAL | Status: DC
  Administered 2020-01-28 – 2020-01-30 (×6): 400 mg via ORAL
  Filled 2020-01-28 (×6): qty 1

## 2020-01-28 NOTE — Progress Notes (Signed)
**Note Stephanie-Identified via Obfuscation** Trauma Service Note  Chief Complaint/Subjective: Pain in right heel feels like needles, just woke up and pain is worse  Objective: Vital signs in last 24 hours: Temp:  [97.8 F (36.6 C)-98.6 F (37 C)] 98.4 F (36.9 C) (02/28 0733) Pulse Rate:  [86-110] 86 (02/28 0733) Resp:  [16-18] 16 (02/28 0733) BP: (104-125)/(63-97) 104/64 (02/28 0733) SpO2:  [98 %-100 %] 98 % (02/28 0733)    Intake/Output from previous day: 02/27 0701 - 02/28 0700 In: 720 [P.O.:720] Out: 0  Intake/Output this shift: No intake/output data recorded.  General: NAD  Lungs: nonlabored  Abd: soft, NT  Extremities: right leg and foot wrapped, some numbness noted on right toe, lateral toes with normal sensation  HEENT: abrasion to left forehead and left periorbital skin  Neuro: AOx4  Lab Results: CBC  Recent Labs    01/26/20 1015 01/27/20 0610  WBC 15.4* 17.1*  HGB 13.5 11.6*  HCT 40.6 34.8*  PLT 328 272   BMET Recent Labs    01/26/20 1015 01/27/20 0610  NA 138 139  K 3.0* 3.6  CL 103 106  CO2 22 22  GLUCOSE 163* 178*  BUN 9 <5*  CREATININE 0.83 0.77  CALCIUM 8.5* 8.8*   PT/INR Recent Labs    01/26/20 1015  LABPROT 12.4  INR 0.9   ABG No results for input(s): PHART, HCO3 in the last 72 hours.  Invalid input(s): PCO2, PO2  Studies/Results: No results found.  Anti-infectives: Anti-infectives (From admission, onward)   Start     Dose/Rate Route Frequency Ordered Stop   01/26/20 1930  cefTRIAXone (ROCEPHIN) 2 g in sodium chloride 0.9 % 100 mL IVPB     2 g 200 mL/hr over 30 Minutes Intravenous Every 24 hours 01/26/20 1901 01/29/20 1929   01/26/20 1551  tobramycin (NEBCIN) powder  Status:  Discontinued       As needed 01/26/20 1551 01/26/20 1744   01/26/20 1551  vancomycin (VANCOCIN) powder  Status:  Discontinued       As needed 01/26/20 1552 01/26/20 1744   01/26/20 1530  ceFAZolin (ANCEF) IVPB 2g/100 mL premix     2 g 200 mL/hr over 30 Minutes Intravenous On call to  O.R. 01/26/20 1157 01/26/20 1541   01/26/20 0945  ceFAZolin (ANCEF) IVPB 2g/100 mL premix     2 g 200 mL/hr over 30 Minutes Intravenous  Once 01/26/20 0941 01/26/20 1135      Medications Scheduled Meds: . acetaminophen  1,000 mg Oral Q6H  . vitamin C  500 mg Oral Daily  . docusate sodium  100 mg Oral BID  . enoxaparin (LOVENOX) injection  40 mg Subcutaneous Q24H  . gabapentin  300 mg Oral TID  . methocarbamol  1,000 mg Oral Q8H   Continuous Infusions: . cefTRIAXone (ROCEPHIN)  IV 2 g (01/27/20 2132)  . lactated ringers 10 mL/hr at 01/27/20 0600   PRN Meds:.eye wash, morphine injection, ondansetron **OR** ondansetron (ZOFRAN) IV, oxyCODONE, polyvinyl alcohol  Assessment/Plan: s/p Procedure(s): IRRIGATION AND DEBRIDEMENT EXTREMITY APPLICATION OF WOUND VAC ORIF  CALCANEUS. WBC slightly increased today. Continued pain issues. -pain control multimodal therapy -48 h antibiotics for prophylaxis   LOS: 2 days   Stephanie Dickson Trauma Surgeon [phone] Stephanie Dickson 01/28/2020
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
## 2020-01-28 NOTE — Progress Notes (Signed)
Paged trauma about pt's complaints of numbness. Pt states that since her sx, she has had numbness to the top of her head. She doesn't recall which provider she previously told, but said she wanted to know if anything was going to be done. Reached out to Dr. Sheliah Hatch to make sure everyone was made aware of pt's complaints. No new orders at this time.

## 2020-01-28 NOTE — Progress Notes (Addendum)
Morphine appears to cause nausea

## 2020-01-28 NOTE — Progress Notes (Signed)
Pt complaining of new symptoms. Pt states the numbness has now spread to the entire left side of her face. Pt states she has a tingling sensation to the left side of her face. Reports that the top of her head is still numb to touch. Dr. Sheliah Hatch paged, new lab orders placed. Provider states he will round on pt shortly. Will continue to monitor.
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
## 2020-01-28 NOTE — Progress Notes (Signed)
Pt requested to have a head CT done- messaged Dr. Sheliah Hatch- no new orders at this time- also mentioned that she had had arrythmias while she ws pregnant and never had followe up with cardiology- she continues to expierince increased tachycradia with exertion-" shes's not having arrythmias at this time"

## 2020-01-28 NOTE — Plan of Care (Signed)
  Problem: Pain Management: Goal: Pain level will decrease Outcome: Progressing   

## 2020-01-29 ENCOUNTER — Encounter: Payer: Self-pay | Admitting: *Deleted

## 2020-01-29 LAB — BASIC METABOLIC PANEL
Anion gap: 8 (ref 5–15)
BUN: 5 mg/dL — ABNORMAL LOW (ref 6–20)
CO2: 27 mmol/L (ref 22–32)
Calcium: 8.7 mg/dL — ABNORMAL LOW (ref 8.9–10.3)
Chloride: 104 mmol/L (ref 98–111)
Creatinine, Ser: 0.75 mg/dL (ref 0.44–1.00)
GFR calc Af Amer: >60 mL/min (ref >60)
GFR calc non Af Amer: >60 mL/min (ref >60)
Glucose, Bld: 121 mg/dL — ABNORMAL HIGH (ref 70–99)
Potassium: 3.5 mmol/L (ref 3.5–5.1)
Sodium: 139 mmol/L (ref 135–145)

## 2020-01-29 LAB — CBC
HCT: 34.7 % — ABNORMAL LOW (ref 36.0–46.0)
Hemoglobin: 11.4 g/dL — ABNORMAL LOW (ref 12.0–15.0)
MCH: 30.3 pg (ref 26.0–34.0)
MCHC: 32.9 g/dL (ref 30.0–36.0)
MCV: 92.3 fL (ref 80.0–100.0)
Platelets: 262 10*3/uL (ref 150–400)
RBC: 3.76 MIL/uL — ABNORMAL LOW (ref 3.87–5.11)
RDW: 13.2 % (ref 11.5–15.5)
WBC: 9.8 10*3/uL (ref 4.0–10.5)
nRBC: 0 % (ref 0.0–0.2)

## 2020-01-29 MED ORDER — OXYCODONE HCL 10 MG PO TABS: 5.0000 mg | ORAL_TABLET | Freq: Four times a day (QID) | ORAL | 0 refills | Status: DC | PRN

## 2020-01-29 MED ORDER — POLYETHYLENE GLYCOL 3350 17 G PO PACK
17.0000 g | PACK | Freq: Every day | ORAL | Status: DC
  Administered 2020-01-29 – 2020-01-30 (×2): 17 g via ORAL
  Filled 2020-01-29 (×2): qty 1

## 2020-01-29 MED ORDER — DOUBLE ANTIBIOTIC 500-10000 UNIT/GM EX OINT
TOPICAL_OINTMENT | Freq: Two times a day (BID) | CUTANEOUS | Status: DC
  Administered 2020-01-29 – 2020-01-30 (×2): 1 via TOPICAL
  Filled 2020-01-29: qty 28.4

## 2020-01-29 MED ORDER — POLYETHYLENE GLYCOL 3350 17 G PO PACK: 17.0000 g | PACK | Freq: Every day | ORAL | 0 refills | Status: DC

## 2020-01-29 MED ORDER — ACETAMINOPHEN 500 MG PO TABS: 1000.0000 mg | ORAL_TABLET | Freq: Four times a day (QID) | ORAL | 0 refills | Status: DC

## 2020-01-29 MED ORDER — DOCUSATE SODIUM 100 MG PO CAPS: 100.0000 mg | ORAL_CAPSULE | Freq: Two times a day (BID) | ORAL | 0 refills | Status: DC

## 2020-01-29 MED ORDER — GABAPENTIN 400 MG PO CAPS: 400.0000 mg | ORAL_CAPSULE | Freq: Three times a day (TID) | ORAL | 0 refills | Status: DC

## 2020-01-29 MED ORDER — ASCORBIC ACID 500 MG PO TABS: 500.0000 mg | ORAL_TABLET | Freq: Every day | ORAL | Status: AC

## 2020-01-29 MED ORDER — METHOCARBAMOL 500 MG PO TABS: 1000.0000 mg | ORAL_TABLET | Freq: Three times a day (TID) | ORAL | 0 refills | Status: DC

## 2020-01-29 MED ORDER — POLYVINYL ALCOHOL 1.4 % OP SOLN: 2.0000 [drp] | OPHTHALMIC | 0 refills | Status: DC | PRN

## 2020-01-29 MED ORDER — DOUBLE ANTIBIOTIC 500-10000 UNIT/GM EX OINT: 1.0000 "application " | TOPICAL_OINTMENT | Freq: Two times a day (BID) | CUTANEOUS | Status: DC
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
## 2020-01-29 NOTE — Progress Notes (Signed)
Called last night due to patient complaining of scalp numbness that expanded over her face. Upon examining her she had parasthesias to the superior scalp and left side of scalp, left peri orbital areas and left cheek. Her right face was fine. She did have sensation to pressure at all areas but stated it doesn't feel normal. We discussed these findings and likely from the abrasions and damage to skin and would likely come back but that i t could take a while. Also we discussed there was not any imaging or medications that would help in the matter.  Later she had the nurse contact me to request a cut of her head and cardiology consult. She was concerned that she had an arrhythmia during her pregnancy. I discussed with the nurse that the parasthesias are not an indication to repeat imaging and since she has not had arrhythmias during the hospitalization a cardiology  consult would be unlikely to help her.
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
## 2020-01-29 NOTE — Progress Notes (Signed)
Orthopedic Tech Progress Note Patient Details:  Stephanie Dickson [birth year] [id number]  Ortho Devices Type of Ortho Device: CAM walker Ortho Device/Splint Location: right   Post Interventions Instructions Provided: Care of device   Saul Fordyce 01/29/2020, 9:52 AM
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
## 2020-01-29 NOTE — Plan of Care (Signed)

## 2020-01-29 NOTE — Plan of Care (Signed)
  Problem: Education: Goal: Knowledge of General Education information will improve Description: Including pain rating scale, medication(s)/side effects and non-pharmacologic comfort measures Outcome: Progressing   Problem: Clinical Measurements: Goal: Respiratory complications will improve Outcome: Progressing Note: On room air   Problem: Activity: Goal: Risk for activity intolerance will decrease Outcome: Progressing Note: Up to [facility] with 1 assist, cam boot in place   Problem: Nutrition: Goal: Adequate nutrition will be maintained Outcome: Progressing   Problem: Coping: Goal: Level of anxiety will decrease Outcome: Progressing   Problem: Elimination: Goal: Will not experience complications related to urinary retention Outcome: Progressing   Problem: Pain Managment: Goal: General experience of comfort will improve Outcome: Progressing Note: Treated for pain twice with oxycodone   Problem: Safety: Goal: Ability to remain free from injury will improve Outcome: Progressing

## 2020-01-30 MED ORDER — ASPIRIN EC 325 MG PO TBEC: 325.0000 mg | DELAYED_RELEASE_TABLET | Freq: Two times a day (BID) | ORAL | 0 refills | Status: AC

## 2020-01-30 MED ORDER — VITAMIN D-3 125 MCG (5000 UT) PO TABS: 5000.0000 [IU] | ORAL_TABLET | Freq: Every day | ORAL | 1 refills | Status: AC

## 2020-01-30 MED ORDER — VITAMIN D (ERGOCALCIFEROL) 1.25 MG (50000 UNIT) PO CAPS: 50000.0000 [IU] | ORAL_CAPSULE | ORAL | 0 refills | Status: DC
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
## 2020-01-30 NOTE — TOC Transition Note (Signed)
Transition of Care [facility]) - CM/SW Discharge Note   Patient Details  Name: Stephanie Dickson MRN: [id number] Date of Birth: 02-27-1996  Transition of Care [facility]) CM/SW Contact:  Glennon Mac, RN Phone Number: 01/30/2020, 12:04 PM   Clinical Narrative:  Pt medically stable for discharge home today with spouse.  DME has been delivered to pt's room.       Final next level of care: OP Rehab Barriers to Discharge: Continued Medical Work up          Discharge Plan and Services   Discharge Planning Services: CM Consult, Indigent Health Clinic, [facility], Medication Assistance            DME Arranged: 3-N-1, Walker rolling, Wheelchair manual DME Agency: [employer] Date DME Agency Contacted: 01/29/20 Time DME Agency Contacted: [phone] Representative spoke with at DME Agency: Oletha Cruel            Social Determinants of Health (SDOH) Interventions     Readmission Risk Interventions No flowsheet data found.  Quintella Baton, RN, BSN  Trauma/Neuro ICU Case Manager [phone]
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
## 2020-01-30 NOTE — Progress Notes (Signed)
Stephanie Dickson to be D/C'd home per MD order.  Discussed prescriptions and follow up appointments with the patient. Prescriptions were sent to patient's preferred pharmacy, medication list explained in detail. Pt verbalized understanding.  Allergies as of 01/30/2020   No Known Allergies     Medication List    TAKE these medications   acetaminophen 500 MG tablet Commonly known as: TYLENOL Take 2 tablets (1,000 mg total) by mouth every 6 (six) hours.   ascorbic acid 500 MG tablet Commonly known as: VITAMIN C Take 1 tablet (500 mg total) by mouth daily.   aspirin EC 325 MG tablet Take 1 tablet (325 mg total) by mouth in the morning and at bedtime.   docusate sodium 100 MG capsule Commonly known as: COLACE Take 1 capsule (100 mg total) by mouth 2 (two) times daily.   gabapentin 400 MG capsule Commonly known as: NEURONTIN Take 1 capsule (400 mg total) by mouth 3 (three) times daily.   ibuprofen 200 MG tablet Commonly known as: ADVIL Take 400 mg by mouth every 6 (six) hours as needed for moderate pain.   methocarbamol 500 MG tablet Commonly known as: ROBAXIN Take 2 tablets (1,000 mg total) by mouth every 8 (eight) hours.   Oxycodone HCl 10 MG Tabs Take 0.5-1 tablets (5-10 mg total) by mouth every 6 (six) hours as needed for moderate pain or severe pain.   polyethylene glycol 17 g packet Commonly known as: MIRALAX / GLYCOLAX Take 17 g by mouth daily.   polymixin-bacitracin 500-10000 UNIT/GM Oint ointment Apply 1 application topically 2 (two) times daily.   polyvinyl alcohol 1.4 % ophthalmic solution Commonly known as: LIQUIFILM TEARS Place 2 drops into the left eye as needed for dry eyes.   Vitamin D (Ergocalciferol) 1.25 MG (50000 UNIT) Caps capsule Commonly known as: DRISDOL Take 1 capsule (50,000 Units total) by mouth every 7 (seven) days.   Vitamin D-3 125 MCG (5000 UT) Tabs Take 5,000 Units by mouth daily.            Durable Medical Equipment  (From  admission, onward)         Start     Ordered   01/29/20 1351  For home use only DME standard manual wheelchair with seat cushion  Once    Comments: Patient suffers from right complex ankle fracture which impairs their ability to perform daily activities like bathing, grooming and toileting in the home.  A cane, crutch or walker will not resolve issue with performing activities of daily living. A wheelchair will allow patient to safely perform daily activities. Patient can safely propel the wheelchair in the home or has a caregiver who can provide assistance. Length of need 6 months . Accessories: elevating leg rests (ELRs), wheel locks, extensions and anti-tippers.   01/29/20 1351   01/29/20 0918  For home use only DME 3 n 1  Once     01/29/20 0917   01/29/20 0918  For home use only DME Walker rolling  Once    Question Answer Comment  Walker: With 5 Inch Wheels   Patient needs a walker to treat with the following condition Open right calcaneal fracture      01/29/20 0917          Vitals:   01/30/20 0756 01/30/20 1259  BP: 111/71 114/69  Pulse: 80 87  Resp: 17 17  Temp: 97.9 F (36.6 C) 98.1 F (36.7 C)  SpO2: 98% 98%    IV catheter discontinued intact. Site  without signs and symptoms of complications. Dressing and pressure applied. Telemetry monitor discontinued. Pt denies pain at this time. No complaints noted.  An After Visit Summary was printed and given to the patient. Patient escorted via Aeronautical engineer, and D/C home via private auto with her husband.  Johnette Abraham [facility] 01/30/2020 1:18 PM
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
## 2020-01-31 ENCOUNTER — Encounter: Payer: Self-pay | Admitting: Student

## 2020-01-31 DIAGNOSIS — S060X9A Concussion with loss of consciousness of unspecified duration, initial encounter: Secondary | ICD-10-CM | POA: Insufficient documentation

## 2020-01-31 DIAGNOSIS — S91301A Unspecified open wound, right foot, initial encounter: Secondary | ICD-10-CM | POA: Insufficient documentation

## 2020-01-31 DIAGNOSIS — S93314A Dislocation of tarsal joint of right foot, initial encounter: Secondary | ICD-10-CM | POA: Insufficient documentation

## 2020-03-12 ENCOUNTER — Other Ambulatory Visit: Payer: Self-pay | Admitting: Student

## 2020-03-12 DIAGNOSIS — S92061D Displaced intraarticular fracture of right calcaneus, subsequent encounter for fracture with routine healing: Secondary | ICD-10-CM | POA: Insufficient documentation

## 2020-03-22 NOTE — Pre-Procedure Instructions (Signed)
Stephanie Dickson  03/22/2020    Your procedure is scheduled on Wednesday, April 28. 2021 at 7:30 AM.   Report to [facility] Entrance "A" Admitting Office at 5:30 AM.   Call this number if you have problems the morning of surgery: [phone]   Questions prior to day of surgery, please call [phone] between 8 & 4 PM.   Remember:  Do not eat food after midnight Tuesday, 03/26/20.  You may drink clear liquids until 4:30 AM. Clear liquids allowed are: Water, Juice (non-citric and without pulp), Carbonated beverages, Clear Tea, Black Coffee only, Plain Jell-O only, Gatorade and Plain Popsicles only   Drink the Pre-Surgery Ensure between 4:15 - 4:30 AM day of surgery. This will be the last thing you drink Wednesday morning    Take these medicines the morning of surgery with A SIP OF WATER: Gabapentin (Neurontin), Methocarbamol (Robaxin), Oxycodone - if needed or Acetaminophen (Tylenol) - if needed  Do not use Multivitamins, Aspirin products (BC Powders, Goody's, etc), NSAIDS (Ibuprofen, Aleve, etc), Herbal medications or Fish Oil prior to surgery.  Do not use vapes/marijuana 24 hours prior to surgery.    Do not wear jewelry, make-up or nail polish.  Do not wear lotions, powders, perfumes or deodorant.  Do not shave 48 hours prior to surgery.    Do not bring valuables to the hospital.  [facility] is not responsible for any belongings or valuables.  Contacts, dentures or bridgework may not be worn into surgery.  Leave your suitcase in the car.  After surgery it may be brought to your room.  For patients admitted to the hospital, discharge time will be determined by your treatment team.  [facility] - Preparing for Surgery  Before surgery, you can play an important role.  Because skin is not sterile, your skin needs to be as free of germs as possible.  You can reduce the number of germs on you skin by washing with CHG (chlorahexidine gluconate) soap before surgery.  CHG is  an antiseptic cleaner which kills germs and bonds with the skin to continue killing germs even after washing.  Oral Hygiene is also important in reducing the risk of infection.  Remember to brush your teeth with your regular toothpaste the morning of surgery.  Please DO NOT use if you have an allergy to CHG or antibacterial soaps.  If your skin becomes reddened/irritated stop using the CHG and inform your nurse when you arrive at Short Stay.  Do not shave (including legs and underarms) for at least 48 hours prior to the first CHG shower.  You may shave your face.  Please follow these instructions carefully:   1.  Shower with CHG Soap the night before surgery and the morning of Surgery.  2.  If you choose to wash your hair, wash your hair first as usual with your normal shampoo.  3.  After you shampoo, rinse your hair and body thoroughly to remove the shampoo. 4.  Use CHG as you would any other liquid soap.  You can apply chg directly to the skin and wash gently with a      scrungie or washcloth.           5.  Apply the CHG Soap to your body ONLY FROM THE NECK DOWN.   Do not use on open wounds or open sores. Avoid contact with your eyes, ears, mouth and genitals (private parts).  Wash genitals (private parts) with your normal soap -  do this prior to using CHG soap.  6.  Wash thoroughly, paying special attention to the area where your surgery will be performed.  7.  Thoroughly rinse your body with warm water from the neck down.  8.  DO NOT shower/wash with your normal soap after using and rinsing off the CHG Soap.  9.  Pat yourself dry with a clean towel.            10.  Wear clean pajamas.            11.  Place clean sheets on your bed the night of your first shower and do not sleep with pets.  Day of Surgery  Shower as above. Do not apply any lotions/deodorants the morning of surgery.   Please wear clean clothes to the hospital/surgery center. Remember to brush your teeth with  toothpaste.  Please read over the fact sheets that you were given.

## 2020-03-25 ENCOUNTER — Encounter (HOSPITAL_COMMUNITY): Payer: Self-pay

## 2020-03-25 ENCOUNTER — Encounter (HOSPITAL_COMMUNITY)
Admission: RE | Admit: 2020-03-25 | Discharge: 2020-03-25 | Disposition: A | Discharge status: Home / Self Care | Payer: Medicaid Other | Source: Ambulatory Visit | Attending: Student | Admitting: Student

## 2020-03-25 ENCOUNTER — Other Ambulatory Visit: Payer: Self-pay

## 2020-03-25 ENCOUNTER — Other Ambulatory Visit (HOSPITAL_COMMUNITY)
Admission: RE | Admit: 2020-03-25 | Discharge: 2020-03-25 | Disposition: A | Discharge status: Home / Self Care | Payer: Medicaid Other | Source: Ambulatory Visit | Attending: Student | Admitting: Student

## 2020-03-25 DIAGNOSIS — Z01812 Encounter for preprocedural laboratory examination: Secondary | ICD-10-CM | POA: Insufficient documentation

## 2020-03-25 DIAGNOSIS — Z20822 Contact with and (suspected) exposure to covid-19: Secondary | ICD-10-CM | POA: Diagnosis not present

## 2020-03-25 HISTORY — DX: Cardiac arrhythmia, unspecified: I49.9

## 2020-03-25 HISTORY — DX: Personal history of urinary calculi: Z87.442

## 2020-03-25 HISTORY — DX: Unspecified asthma, uncomplicated: J45.909

## 2020-03-25 HISTORY — DX: Anxiety disorder, unspecified: F41.9

## 2020-03-25 LAB — SURGICAL PCR SCREEN
MRSA, PCR: NEGATIVE
Staphylococcus aureus: NEGATIVE

## 2020-03-25 LAB — CBC WITH DIFFERENTIAL/PLATELET
Abs Immature Granulocytes: 0.01 10*3/uL (ref 0.00–0.07)
Basophils Absolute: 0.1 10*3/uL (ref 0.0–0.1)
Basophils Relative: 1 %
Eosinophils Absolute: 0.1 10*3/uL (ref 0.0–0.5)
Eosinophils Relative: 1 %
HCT: 38.2 % (ref 36.0–46.0)
Hemoglobin: 12.7 g/dL (ref 12.0–15.0)
Immature Granulocytes: 0 %
Lymphocytes Relative: 25 %
Lymphs Abs: 1.8 10*3/uL (ref 0.7–4.0)
MCH: 30.1 pg (ref 26.0–34.0)
MCHC: 33.2 g/dL (ref 30.0–36.0)
MCV: 90.5 fL (ref 80.0–100.0)
Monocytes Absolute: 0.6 10*3/uL (ref 0.1–1.0)
Monocytes Relative: 9 %
Neutro Abs: 4.5 10*3/uL (ref 1.7–7.7)
Neutrophils Relative %: 64 %
Platelets: 307 10*3/uL (ref 150–400)
RBC: 4.22 MIL/uL (ref 3.87–5.11)
RDW: 12.8 % (ref 11.5–15.5)
WBC: 7 10*3/uL (ref 4.0–10.5)
nRBC: 0 % (ref 0.0–0.2)

## 2020-03-25 LAB — SARS CORONAVIRUS 2 (TAT 6-24 HRS): SARS Coronavirus 2: NEGATIVE

## 2020-03-25 NOTE — Progress Notes (Signed)
PCP - Eunice Blase, PA Cardiologist - Saw Dr. Wynonia Hazard in 2015 for fast heartrate  EKG - 01/26/20 ECHO - 09/24/14  (requested results)  ERAS Protcol - yes PRE-SURGERY Ensure or G2- Ensure  COVID TEST- today   Anesthesia review: yes, requested Echo and hx of tachycardia  Patient denies shortness of breath, fever, cough and chest pain at PAT appointment   All instructions explained to the patient, with a verbal understanding of the material. Patient agrees to go over the instructions while at home for a better understanding. Patient also instructed to self quarantine after being tested for COVID-19. The opportunity to ask questions was provided.

## 2020-03-26 NOTE — Anesthesia Preprocedure Evaluation (Addendum)
Anesthesia Evaluation  Patient identified by MRN, date of birth, ID band Patient awake    Reviewed: Allergy & Precautions, NPO status , Patient's Chart, lab work & pertinent test results  Airway Mallampati: I  TM Distance: >3 FB Neck ROM: Full    Dental no notable dental hx. (+) Teeth Intact, Dental Advisory Given   Pulmonary asthma ,  hasnt used inhalers in years   Pulmonary exam normal breath sounds clear to auscultation       Cardiovascular Normal cardiovascular exam Rhythm:Regular Rate:Normal     Neuro/Psych PSYCHIATRIC DISORDERS Anxiety negative neurological ROS     GI/Hepatic negative GI ROS, (+)     substance abuse  marijuana use,   Endo/Other  Obesity BMI 34  Renal/GU negative Renal ROS  negative genitourinary   Musculoskeletal Open displaced intra-articular fx right calcaneus   Abdominal Normal abdominal exam  (+)   Peds negative pediatric ROS (+)  Hematology negative hematology ROS (+)   Anesthesia Other Findings   Reproductive/Obstetrics negative OB ROS                                                             Anesthesia Evaluation  Patient identified by MRN, date of birth, ID band Patient awake    Reviewed: Allergy & Precautions, H&P , NPO status , Patient's Chart, lab work & pertinent test results  Airway Mallampati: III  TM Distance: >3 FB Neck ROM: Full    Dental no notable dental hx. (+) Teeth Intact, Dental Advisory Given   Pulmonary neg pulmonary ROS,    Pulmonary exam normal breath sounds clear to auscultation       Cardiovascular negative cardio ROS   Rhythm:Regular Rate:Normal     Neuro/Psych negative neurological ROS  negative psych ROS   GI/Hepatic negative GI ROS, Neg liver ROS,   Endo/Other  negative endocrine ROS  Renal/GU negative Renal ROS  negative genitourinary   Musculoskeletal   Abdominal   Peds   Hematology negative hematology ROS (+)   Anesthesia Other Findings   Reproductive/Obstetrics negative OB ROS                            Anesthesia Physical Anesthesia Plan  ASA: II  Anesthesia Plan: General   Post-op Pain Management:    Induction: Intravenous, Rapid sequence and Cricoid pressure planned  PONV Risk Score and Plan: 4 or greater and Ondansetron, Dexamethasone and Midazolam  Airway Management Planned: Oral ETT  Additional Equipment:   Intra-op Plan:   Post-operative Plan: Extubation in OR  Informed Consent: I have reviewed the patients History and Physical, chart, labs and discussed the procedure including the risks, benefits and alternatives for the proposed anesthesia with the patient or authorized representative who has indicated his/her understanding and acceptance.     Dental advisory given  Plan Discussed with: CRNA  Anesthesia Plan Comments:        Anesthesia Quick Evaluation  Anesthesia Physical Anesthesia Plan  ASA: II  Anesthesia Plan: General and Regional   Post-op Pain Management: GA combined w/ Regional for post-op pain   Induction: Intravenous  PONV Risk Score and Plan: 3 and Ondansetron, Dexamethasone, Midazolam, Scopolamine patch - Pre-op and Treatment may vary due to age or medical condition  Airway Management Planned: LMA  Additional Equipment: None  Intra-op Plan:   Post-operative Plan: Extubation in OR  Informed Consent: I have reviewed the patients History and Physical, chart, labs and discussed the procedure including the risks, benefits and alternatives for the proposed anesthesia with the patient or authorized representative who has indicated his/her understanding and acceptance.     Dental advisory given  Plan Discussed with: CRNA  Anesthesia Plan Comments: (PAT note written 03/26/2020 by Shonna Chock, PA-C. )       Anesthesia Quick Evaluation
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
## 2020-03-27 ENCOUNTER — Observation Stay (HOSPITAL_COMMUNITY)
Admission: RE | Admit: 2020-03-27 | Discharge: 2020-03-29 | Disposition: A | Discharge status: Home / Self Care | Payer: Medicaid Other | Attending: Student | Admitting: Student

## 2020-03-27 ENCOUNTER — Inpatient Hospital Stay (HOSPITAL_COMMUNITY): Payer: Medicaid Other

## 2020-03-27 ENCOUNTER — Inpatient Hospital Stay (HOSPITAL_COMMUNITY): Payer: Medicaid Other | Admitting: Anesthesiology

## 2020-03-27 ENCOUNTER — Other Ambulatory Visit: Payer: Self-pay

## 2020-03-27 ENCOUNTER — Observation Stay (HOSPITAL_COMMUNITY): Payer: Medicaid Other

## 2020-03-27 ENCOUNTER — Encounter (HOSPITAL_COMMUNITY)
Admission: RE | Disposition: A | Discharge status: Home / Self Care | Payer: Self-pay | Source: Home / Self Care | Attending: Student

## 2020-03-27 ENCOUNTER — Encounter (HOSPITAL_COMMUNITY): Payer: Self-pay | Admitting: Student

## 2020-03-27 DIAGNOSIS — Z6832 Body mass index (BMI) 32.0-32.9, adult: Secondary | ICD-10-CM | POA: Diagnosis not present

## 2020-03-27 DIAGNOSIS — Z79899 Other long term (current) drug therapy: Secondary | ICD-10-CM | POA: Diagnosis not present

## 2020-03-27 DIAGNOSIS — I499 Cardiac arrhythmia, unspecified: Secondary | ICD-10-CM | POA: Insufficient documentation

## 2020-03-27 DIAGNOSIS — S92061A Displaced intraarticular fracture of right calcaneus, initial encounter for closed fracture: Secondary | ICD-10-CM | POA: Diagnosis present

## 2020-03-27 DIAGNOSIS — T148XXA Other injury of unspecified body region, initial encounter: Secondary | ICD-10-CM

## 2020-03-27 DIAGNOSIS — T8484XA Pain due to internal orthopedic prosthetic devices, implants and grafts, initial encounter: Secondary | ICD-10-CM | POA: Diagnosis not present

## 2020-03-27 DIAGNOSIS — X58XXXA Exposure to other specified factors, initial encounter: Secondary | ICD-10-CM | POA: Diagnosis not present

## 2020-03-27 DIAGNOSIS — E669 Obesity, unspecified: Secondary | ICD-10-CM | POA: Diagnosis not present

## 2020-03-27 DIAGNOSIS — M19071 Primary osteoarthritis, right ankle and foot: Secondary | ICD-10-CM | POA: Insufficient documentation

## 2020-03-27 DIAGNOSIS — F419 Anxiety disorder, unspecified: Secondary | ICD-10-CM | POA: Insufficient documentation

## 2020-03-27 DIAGNOSIS — E7800 Pure hypercholesterolemia, unspecified: Secondary | ICD-10-CM | POA: Insufficient documentation

## 2020-03-27 DIAGNOSIS — S92061D Displaced intraarticular fracture of right calcaneus, subsequent encounter for fracture with routine healing: Secondary | ICD-10-CM | POA: Insufficient documentation

## 2020-03-27 DIAGNOSIS — Z87442 Personal history of urinary calculi: Secondary | ICD-10-CM | POA: Insufficient documentation

## 2020-03-27 DIAGNOSIS — Z419 Encounter for procedure for purposes other than remedying health state, unspecified: Secondary | ICD-10-CM

## 2020-03-27 HISTORY — PX: ORIF CALCANEOUS FRACTURE: SHX5030

## 2020-03-27 LAB — POCT PREGNANCY, URINE: Preg Test, Ur: NEGATIVE

## 2020-03-27 SURGERY — OPEN REDUCTION INTERNAL FIXATION (ORIF) CALCANEOUS FRACTURE
Anesthesia: Regional | Laterality: Right

## 2020-03-27 MED ORDER — CEFAZOLIN SODIUM-DEXTROSE 2-4 GM/100ML-% IV SOLN
2.0000 g | INTRAVENOUS | Status: AC
  Administered 2020-03-27: 2 g via INTRAVENOUS
  Filled 2020-03-27: qty 100

## 2020-03-27 MED ORDER — OXYCODONE HCL 5 MG PO TABS
10.0000 mg | ORAL_TABLET | ORAL | Status: DC | PRN
  Administered 2020-03-28: 18:00:00 10 mg via ORAL
  Administered 2020-03-29: 10:00:00 15 mg via ORAL
  Filled 2020-03-27: qty 3
  Filled 2020-03-27: qty 2

## 2020-03-27 MED ORDER — METHOCARBAMOL 500 MG PO TABS
500.0000 mg | ORAL_TABLET | Freq: Four times a day (QID) | ORAL | Status: DC | PRN
  Administered 2020-03-28 – 2020-03-29 (×3): 500 mg via ORAL
  Filled 2020-03-27 (×3): qty 1

## 2020-03-27 MED ORDER — DEXAMETHASONE SODIUM PHOSPHATE 10 MG/ML IJ SOLN
INTRAMUSCULAR | Status: AC
  Filled 2020-03-27: qty 1

## 2020-03-27 MED ORDER — METHOCARBAMOL 1000 MG/10ML IJ SOLN
500.0000 mg | Freq: Four times a day (QID) | INTRAVENOUS | Status: DC | PRN
  Filled 2020-03-27: qty 5

## 2020-03-27 MED ORDER — HYDROMORPHONE HCL 1 MG/ML IJ SOLN: 0.2500 mg | INTRAMUSCULAR | Status: DC | PRN

## 2020-03-27 MED ORDER — VANCOMYCIN HCL 1000 MG IV SOLR
INTRAVENOUS | Status: DC | PRN
  Administered 2020-03-27: 1000 mg

## 2020-03-27 MED ORDER — SCOPOLAMINE 1 MG/3DAYS TD PT72
1.0000 | MEDICATED_PATCH | TRANSDERMAL | Status: DC
  Administered 2020-03-27: 1.5 mg via TRANSDERMAL
  Filled 2020-03-27: qty 1

## 2020-03-27 MED ORDER — MIDAZOLAM HCL 2 MG/2ML IJ SOLN
INTRAMUSCULAR | Status: AC
  Filled 2020-03-27: qty 2

## 2020-03-27 MED ORDER — 0.9 % SODIUM CHLORIDE (POUR BTL) OPTIME
TOPICAL | Status: DC | PRN
  Administered 2020-03-27: 1000 mL

## 2020-03-27 MED ORDER — ONDANSETRON HCL 4 MG/2ML IJ SOLN: 4.0000 mg | Freq: Four times a day (QID) | INTRAMUSCULAR | Status: DC | PRN

## 2020-03-27 MED ORDER — ACETAMINOPHEN 500 MG PO TABS
1000.0000 mg | ORAL_TABLET | Freq: Four times a day (QID) | ORAL | Status: DC
  Administered 2020-03-27 – 2020-03-28 (×6): 1000 mg via ORAL
  Filled 2020-03-27 (×6): qty 2

## 2020-03-27 MED ORDER — PROPOFOL 10 MG/ML IV BOLUS
INTRAVENOUS | Status: AC
  Filled 2020-03-27: qty 40

## 2020-03-27 MED ORDER — ROPIVACAINE HCL 5 MG/ML IJ SOLN
INTRAMUSCULAR | Status: DC | PRN
  Administered 2020-03-27: 40 mL via PERINEURAL

## 2020-03-27 MED ORDER — VANCOMYCIN HCL 1000 MG IV SOLR
INTRAVENOUS | Status: AC
  Filled 2020-03-27: qty 1000

## 2020-03-27 MED ORDER — PROMETHAZINE HCL 25 MG/ML IJ SOLN: 6.2500 mg | INTRAMUSCULAR | Status: DC | PRN

## 2020-03-27 MED ORDER — PROPOFOL 10 MG/ML IV BOLUS
INTRAVENOUS | Status: DC | PRN
  Administered 2020-03-27: 200 mg via INTRAVENOUS

## 2020-03-27 MED ORDER — KETOROLAC TROMETHAMINE 30 MG/ML IJ SOLN: 30.0000 mg | Freq: Once | INTRAMUSCULAR | Status: DC | PRN

## 2020-03-27 MED ORDER — HYDROMORPHONE HCL 1 MG/ML IJ SOLN
0.5000 mg | INTRAMUSCULAR | Status: DC | PRN
  Administered 2020-03-28 (×2): 1 mg via INTRAVENOUS
  Filled 2020-03-27 (×2): qty 1

## 2020-03-27 MED ORDER — ONDANSETRON HCL 4 MG PO TABS: 4.0000 mg | ORAL_TABLET | Freq: Four times a day (QID) | ORAL | Status: DC | PRN

## 2020-03-27 MED ORDER — DEXAMETHASONE SODIUM PHOSPHATE 10 MG/ML IJ SOLN
INTRAMUSCULAR | Status: DC | PRN
  Administered 2020-03-27: 8 mg via INTRAVENOUS

## 2020-03-27 MED ORDER — PHENYLEPHRINE HCL (PRESSORS) 10 MG/ML IV SOLN
INTRAVENOUS | Status: DC | PRN
Start: 2020-03-27 — End: 2020-03-27
  Administered 2020-03-27 (×4): 80 ug via INTRAVENOUS

## 2020-03-27 MED ORDER — OXYCODONE HCL 5 MG PO TABS
5.0000 mg | ORAL_TABLET | ORAL | Status: DC | PRN
  Administered 2020-03-29: 04:00:00 10 mg via ORAL
  Filled 2020-03-27: qty 2

## 2020-03-27 MED ORDER — GABAPENTIN 100 MG PO CAPS
100.0000 mg | ORAL_CAPSULE | Freq: Three times a day (TID) | ORAL | Status: DC
  Administered 2020-03-27 – 2020-03-29 (×6): 100 mg via ORAL
  Filled 2020-03-27 (×6): qty 1

## 2020-03-27 MED ORDER — POLYETHYLENE GLYCOL 3350 17 G PO PACK: 17.0000 g | PACK | Freq: Every day | ORAL | Status: DC | PRN

## 2020-03-27 MED ORDER — FENTANYL CITRATE (PF) 250 MCG/5ML IJ SOLN
INTRAMUSCULAR | Status: DC | PRN
  Administered 2020-03-27: 100 ug via INTRAVENOUS

## 2020-03-27 MED ORDER — LIDOCAINE HCL (CARDIAC) PF 100 MG/5ML IV SOSY
PREFILLED_SYRINGE | INTRAVENOUS | Status: DC | PRN
  Administered 2020-03-27: 40 mg via INTRATRACHEAL

## 2020-03-27 MED ORDER — OXYCODONE HCL 5 MG PO TABS: 5.0000 mg | ORAL_TABLET | Freq: Once | ORAL | Status: DC | PRN

## 2020-03-27 MED ORDER — ACETAMINOPHEN 500 MG PO TABS
1000.0000 mg | ORAL_TABLET | Freq: Once | ORAL | Status: AC
  Administered 2020-03-27: 1000 mg via ORAL
  Filled 2020-03-27: qty 2

## 2020-03-27 MED ORDER — DOCUSATE SODIUM 100 MG PO CAPS
100.0000 mg | ORAL_CAPSULE | Freq: Two times a day (BID) | ORAL | Status: DC
  Administered 2020-03-27 – 2020-03-29 (×5): 100 mg via ORAL
  Filled 2020-03-27 (×5): qty 1

## 2020-03-27 MED ORDER — METOCLOPRAMIDE HCL 5 MG/ML IJ SOLN: 5.0000 mg | Freq: Three times a day (TID) | INTRAMUSCULAR | Status: DC | PRN

## 2020-03-27 MED ORDER — POTASSIUM CHLORIDE IN NACL 20-0.9 MEQ/L-% IV SOLN: INTRAVENOUS | Status: DC

## 2020-03-27 MED ORDER — CEFAZOLIN SODIUM-DEXTROSE 2-4 GM/100ML-% IV SOLN
2.0000 g | Freq: Three times a day (TID) | INTRAVENOUS | Status: AC
  Administered 2020-03-27 – 2020-03-28 (×3): 2 g via INTRAVENOUS
  Filled 2020-03-27 (×3): qty 100

## 2020-03-27 MED ORDER — MEPERIDINE HCL 25 MG/ML IJ SOLN: 6.2500 mg | INTRAMUSCULAR | Status: DC | PRN

## 2020-03-27 MED ORDER — ONDANSETRON HCL 4 MG/2ML IJ SOLN
INTRAMUSCULAR | Status: DC | PRN
  Administered 2020-03-27: 4 mg via INTRAVENOUS

## 2020-03-27 MED ORDER — METOCLOPRAMIDE HCL 5 MG PO TABS: 5.0000 mg | ORAL_TABLET | Freq: Three times a day (TID) | ORAL | Status: DC | PRN

## 2020-03-27 MED ORDER — MIDAZOLAM HCL 2 MG/2ML IJ SOLN
INTRAMUSCULAR | Status: DC | PRN
Start: 2020-03-27 — End: 2020-03-27
  Administered 2020-03-27: 2 mg via INTRAVENOUS

## 2020-03-27 MED ORDER — ASPIRIN 325 MG PO TABS
325.0000 mg | ORAL_TABLET | Freq: Every day | ORAL | Status: DC
  Administered 2020-03-28 – 2020-03-29 (×2): 325 mg via ORAL
  Filled 2020-03-27 (×2): qty 1

## 2020-03-27 MED ORDER — DEXAMETHASONE SODIUM PHOSPHATE 10 MG/ML IJ SOLN
INTRAMUSCULAR | Status: DC | PRN
  Administered 2020-03-27: 10 mg

## 2020-03-27 MED ORDER — ONDANSETRON HCL 4 MG/2ML IJ SOLN
INTRAMUSCULAR | Status: AC
  Filled 2020-03-27: qty 2

## 2020-03-27 MED ORDER — LACTATED RINGERS IV SOLN: INTRAVENOUS | Status: DC | PRN

## 2020-03-27 MED ORDER — FENTANYL CITRATE (PF) 250 MCG/5ML IJ SOLN
INTRAMUSCULAR | Status: AC
  Filled 2020-03-27: qty 5

## 2020-03-27 MED ORDER — OXYCODONE HCL 5 MG/5ML PO SOLN: 5.0000 mg | Freq: Once | ORAL | Status: DC | PRN

## 2020-03-27 MED ORDER — MAGNESIUM CITRATE PO SOLN
1.0000 | Freq: Once | ORAL | Status: AC
  Administered 2020-03-27: 1 via ORAL
  Filled 2020-03-27: qty 296

## 2020-03-27 MED ORDER — ACETAMINOPHEN 500 MG PO TABS
ORAL_TABLET | ORAL | Status: AC
  Filled 2020-03-27: qty 2

## 2020-03-27 SURGICAL SUPPLY — 65 items
BANDAGE ESMARK 6X9 LF (GAUZE/BANDAGES/DRESSINGS) ×1 IMPLANT
BLADE SURG 10 STRL SS (BLADE) ×3 IMPLANT
BNDG COHESIVE 4X5 TAN STRL (GAUZE/BANDAGES/DRESSINGS) ×3 IMPLANT
BNDG ELASTIC 4X5.8 VLCR STR LF (GAUZE/BANDAGES/DRESSINGS) ×3 IMPLANT
BNDG ELASTIC 6X5.8 VLCR STR LF (GAUZE/BANDAGES/DRESSINGS) ×3 IMPLANT
BNDG ESMARK 6X9 LF (GAUZE/BANDAGES/DRESSINGS) ×3
BONE CHIP PRESERV 40CC PCAN1/2 (Bone Implant) ×3 IMPLANT
BRUSH SCRUB EZ PLAIN DRY (MISCELLANEOUS) ×6 IMPLANT
CHLORAPREP W/TINT 26 (MISCELLANEOUS) ×3 IMPLANT
CONNECTOR 5 IN 1 STRAIGHT STRL (MISCELLANEOUS) ×3 IMPLANT
COVER MAYO STAND STRL (DRAPES) ×3 IMPLANT
COVER SURGICAL LIGHT HANDLE (MISCELLANEOUS) ×6 IMPLANT
COVER WAND RF STERILE (DRAPES) ×3 IMPLANT
DRAPE C-ARM 42X72 X-RAY (DRAPES) ×3 IMPLANT
DRAPE C-ARMOR (DRAPES) ×3 IMPLANT
DRAPE INCISE IOBAN 66X45 STRL (DRAPES) ×3 IMPLANT
DRAPE ORTHO SPLIT 77X108 STRL (DRAPES) ×4
DRAPE SURG ORHT 6 SPLT 77X108 (DRAPES) ×2 IMPLANT
DRAPE U-SHAPE 47X51 STRL (DRAPES) ×3 IMPLANT
DRSG EMULSION OIL 3X3 NADH (GAUZE/BANDAGES/DRESSINGS) ×3 IMPLANT
DRSG MEPITEL 4X7.2 (GAUZE/BANDAGES/DRESSINGS) ×3 IMPLANT
ELECT REM PT RETURN 9FT ADLT (ELECTROSURGICAL) ×3
ELECTRODE REM PT RTRN 9FT ADLT (ELECTROSURGICAL) ×1 IMPLANT
GAUZE SPONGE 4X4 12PLY STRL (GAUZE/BANDAGES/DRESSINGS) ×3 IMPLANT
GAUZE SPONGE 4X4 12PLY STRL LF (GAUZE/BANDAGES/DRESSINGS) ×3 IMPLANT
GLOVE BIO SURGEON STRL SZ 6.5 (GLOVE) ×6 IMPLANT
GLOVE BIO SURGEON STRL SZ7.5 (GLOVE) ×12 IMPLANT
GLOVE BIO SURGEONS STRL SZ 6.5 (GLOVE) ×3
GLOVE BIOGEL PI IND STRL 6.5 (GLOVE) ×1 IMPLANT
GLOVE BIOGEL PI IND STRL 7.5 (GLOVE) ×1 IMPLANT
GLOVE BIOGEL PI INDICATOR 6.5 (GLOVE) ×2
GLOVE BIOGEL PI INDICATOR 7.5 (GLOVE) ×2
GOWN STRL REUS W/ TWL LRG LVL3 (GOWN DISPOSABLE) ×2 IMPLANT
GOWN STRL REUS W/TWL LRG LVL3 (GOWN DISPOSABLE) ×4
KIT BASIN OR (CUSTOM PROCEDURE TRAY) ×3 IMPLANT
KIT INFUSE MEDIUM (Orthopedic Implant) ×3 IMPLANT
KIT TURNOVER KIT B (KITS) ×3 IMPLANT
MANIFOLD NEPTUNE II (INSTRUMENTS) ×3 IMPLANT
NEEDLE HYPO 21X1.5 SAFETY (NEEDLE) IMPLANT
NS IRRIG 1000ML POUR BTL (IV SOLUTION) ×3 IMPLANT
PACK ORTHO EXTREMITY (CUSTOM PROCEDURE TRAY) ×3 IMPLANT
PAD ARMBOARD 7.5X6 YLW CONV (MISCELLANEOUS) ×6 IMPLANT
PAD CAST 4YDX4 CTTN HI CHSV (CAST SUPPLIES) ×1 IMPLANT
PADDING CAST COTTON 4X4 STRL (CAST SUPPLIES) ×2
PADDING CAST COTTON 6X4 STRL (CAST SUPPLIES) ×3 IMPLANT
PIN GUIDE DRILL TIP 2.8X300 (DRILL) ×9 IMPLANT
SCREW CANN 8.0X70 16 THRD (Screw) ×1 IMPLANT
SCREW CANNULATED 8.0X70MM (Screw) ×3 IMPLANT
SCREW FT CANN 8.0X65MM (Screw) ×3 IMPLANT
SCREW SHANZ 4.0X60MM (EXFIX) IMPLANT
SPONGE LAP 18X18 RF (DISPOSABLE) IMPLANT
SUCTION FRAZIER HANDLE 10FR (MISCELLANEOUS) ×2
SUCTION TUBE FRAZIER 10FR DISP (MISCELLANEOUS) ×1 IMPLANT
SUT ETHILON 3 0 PS 1 (SUTURE) ×6 IMPLANT
SUT VIC AB 0 CT1 27 (SUTURE) ×2
SUT VIC AB 0 CT1 27XBRD ANBCTR (SUTURE) ×1 IMPLANT
SUT VIC AB 2-0 CT1 27 (SUTURE)
SUT VIC AB 2-0 CT1 TAPERPNT 27 (SUTURE) IMPLANT
SYR CONTROL 10ML LL (SYRINGE) IMPLANT
TOWEL GREEN STERILE (TOWEL DISPOSABLE) ×6 IMPLANT
TOWEL GREEN STERILE FF (TOWEL DISPOSABLE) ×3 IMPLANT
TUBE CONNECTING 12'X1/4 (SUCTIONS) ×2
TUBE CONNECTING 12X1/4 (SUCTIONS) ×4 IMPLANT
UNDERPAD 30X30 (UNDERPADS AND DIAPERS) ×3 IMPLANT
WATER STERILE IRR 1000ML POUR (IV SOLUTION) ×3 IMPLANT

## 2020-03-27 NOTE — Anesthesia Procedure Notes (Addendum)
Anesthesia Regional Block: Popliteal block   Pre-Anesthetic Checklist: ,, timeout performed, Correct Patient, Correct Site, Correct Laterality, Correct Procedure, Correct Position, site marked, Risks and benefits discussed,  Surgical consent,  Pre-op evaluation,  At surgeon's request and post-op pain management  Laterality: Right  Prep: Maximum Sterile Barrier Precautions used, chloraprep       Needles:  Injection technique: Single-shot  Needle Type: Echogenic Stimulator Needle     Needle Length: 9cm  Needle Gauge: 22     Additional Needles:   Procedures:,,,, ultrasound used (permanent image in chart),,,,  Narrative:  Start time: 03/27/2020 7:00 AM End time: 03/27/2020 7:05 AM Injection made incrementally with aspirations every 5 mL.  Performed by: Personally  Anesthesiologist: Lannie Fields, DO  Additional Notes: Monitors applied. No increased pain on injection. No increased resistance to injection. Injection made in 5cc increments. Good needle visualization. Patient tolerated procedure well.

## 2020-03-27 NOTE — Anesthesia Procedure Notes (Signed)
Procedure Name: LMA Insertion Date/Time: 03/27/2020 7:34 AM Performed by: Jodell Cipro, CRNA Pre-anesthesia Checklist: Patient identified, Emergency Drugs available, Suction available and Patient being monitored Patient Re-evaluated:Patient Re-evaluated prior to induction Oxygen Delivery Method: Circle System Utilized Preoxygenation: Pre-oxygenation with 100% oxygen Induction Type: IV induction Ventilation: Mask ventilation without difficulty LMA: LMA inserted LMA Size: 4.0 Number of attempts: 1 Airway Equipment and Method: Bite block Placement Confirmation: positive ETCO2 Tube secured with: Tape Dental Injury: Teeth and Oropharynx as per pre-operative assessment

## 2020-03-27 NOTE — Interval H&P Note (Signed)
History and Physical Interval Note:  03/27/2020 7:24 AM  Stephanie Dickson  has presented today for surgery, with the diagnosis of Open displaced intra-articular fracture of right calcaneus.  The various methods of treatment have been discussed with the patient and family. After consideration of risks, benefits and other options for treatment, the patient has consented to  RIGHT SUBTALAR FUSION as a surgical intervention.  The patient's history has been reviewed, patient examined, no change in status, stable for surgery.  I have reviewed the patient's chart and labs.  Questions were answered to the patient's satisfaction.     Caryn Bee P Kariem Wolfson
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
## 2020-03-27 NOTE — Anesthesia Postprocedure Evaluation (Signed)
Anesthesia Post Note  Patient: Stephanie Dickson  Procedure(s) Performed: SUBTALAR FUSION WITH RIA HARVEST (Right )     Patient location during evaluation: PACU Anesthesia Type: Regional and General Level of consciousness: awake and alert, oriented and patient cooperative Pain management: pain level controlled Vital Signs Assessment: post-procedure vital signs reviewed and stable Respiratory status: spontaneous breathing, nonlabored ventilation and respiratory function stable Cardiovascular status: blood pressure returned to baseline and stable Postop Assessment: no apparent nausea or vomiting Anesthetic complications: no    Last Vitals:  Vitals:   03/27/20 1015 03/27/20 1030  BP: 113/69 112/63  Pulse: 84 82  Resp: 13 14  Temp:    SpO2: 99% 98%    Last Pain:  Vitals:   03/27/20 1030  TempSrc:   PainSc: 0-No pain                 Tennis Must [name]

## 2020-03-27 NOTE — Evaluation (Signed)
Physical Therapy Evaluation Patient Details Name: Stephanie Dickson MRN: [id number] DOB: [birth year] Today's Date: 03/27/2020   History of Present Illness  Pt is a 24 y/o female s/p subtalar fusion and removal of calcaneal hardware. PMH includes calcaneal fx, asthma.   Clinical Impression  Pt is s/p surgery above with deficits below. Pt tolerated mobility well and required supervision for gait using RW. Able to maintain NWB on RLE well. Will need to ensure safety with stair management prior to d/c. Will continue to follow acutely to maximize functional mobility independence and safety.      Follow Up Recommendations No PT follow up    Equipment Recommendations  None recommended by PT    Recommendations for Other Services       Precautions / Restrictions Precautions Precautions: Fall Restrictions Weight Bearing Restrictions: Yes RLE Weight Bearing: Non weight bearing      Mobility  Bed Mobility Overal bed mobility: Modified Independent                Transfers Overall transfer level: Modified independent                  Ambulation/Gait Ambulation/Gait assistance: Supervision Gait Distance (Feet): 100 Feet Assistive device: Rolling walker (2 wheeled) Gait Pattern/deviations: Step-to pattern Gait velocity: Decreased   General Gait Details: Hop to pattern using RW. Supervision for safety and line management. Demonstrated good technique with RW.    Stairs            Wheelchair Mobility    Modified Rankin (Stroke Patients Only)       Balance Overall balance assessment: Needs assistance Sitting-balance support: No upper extremity supported;Feet supported Sitting balance-Leahy Scale: Good     Standing balance support: Bilateral upper extremity supported;During functional activity Standing balance-Leahy Scale: Poor Standing balance comment: Reliant on BUE support                              Pertinent Vitals/Pain Pain Assessment:  No/denies pain    Home Living Family/patient expects to be discharged to:: Private residence Living Arrangements: Spouse/significant other;Children Available Help at Discharge: Family;Available 24 hours/day Type of Home: House Home Access: Stairs to enter Entrance Stairs-Rails: None Entrance Stairs-Number of Steps: 2 Home Layout: One level Home Equipment: Walker - 2 wheels;Bedside commode;Wheelchair - manual;Shower seat      Prior Function Level of Independence: Independent with assistive device(s)         Comments: Uses RW vs. knee scooter vs. knee crutch     Hand Dominance        Extremity/Trunk Assessment   Upper Extremity Assessment Upper Extremity Assessment: Overall WFL for tasks assessed    Lower Extremity Assessment Lower Extremity Assessment: RLE deficits/detail RLE Deficits / Details: R foot still numb from surgery.     Cervical / Trunk Assessment Cervical / Trunk Assessment: Normal  Communication   Communication: No difficulties  Cognition Arousal/Alertness: Awake/alert Behavior During Therapy: WFL for tasks assessed/performed Overall Cognitive Status: Within Functional Limits for tasks assessed                                        General Comments General comments (skin integrity, edema, etc.): Pt's husband present during session     Exercises     Assessment/Plan    PT Assessment Patient needs continued PT services  PT  Problem List Decreased mobility;Impaired sensation       PT Treatment Interventions Gait training;Stair training;Therapeutic activities;Functional mobility training;DME instruction;Balance training;Therapeutic exercise;Patient/family education    PT Goals (Current goals can be found in the Care Plan section)  Acute Rehab PT Goals Patient Stated Goal: to go home PT Goal Formulation: With patient Time For Goal Achievement: 04/10/20 Potential to Achieve Goals: Good    Frequency Min 5X/week   Barriers to  discharge        Co-evaluation               AM-PAC PT "6 Clicks" Mobility  Outcome Measure Help needed turning from your back to your side while in a flat bed without using bedrails?: None Help needed moving from lying on your back to sitting on the side of a flat bed without using bedrails?: None Help needed moving to and from a bed to a chair (including a wheelchair)?: None Help needed standing up from a chair using your arms (e.g., wheelchair or bedside chair)?: None Help needed to walk in hospital room?: None Help needed climbing 3-5 steps with a railing? : A Little 6 Click Score: 23    End of Session   Activity Tolerance: Patient tolerated treatment well Patient left: in bed;with call bell/phone within reach;with family/visitor present Nurse Communication: Mobility status PT Visit Diagnosis: Other abnormalities of gait and mobility (R26.89)    Time: 2800-3491 PT Time Calculation (min) (ACUTE ONLY): 15 min   Charges:   PT Evaluation $PT Eval Low Complexity: 1 Low          Cindee Salt, DPT  Acute Rehabilitation Services  Pager: [phone] Office: [phone]   Lehman Prom 03/27/2020, 5:15 PM

## 2020-03-27 NOTE — Anesthesia Procedure Notes (Signed)
Anesthesia Regional Block: Adductor canal block   Pre-Anesthetic Checklist: ,, timeout performed, Correct Patient, Correct Site, Correct Laterality, Correct Procedure, Correct Position, site marked, Risks and benefits discussed,  Surgical consent,  Pre-op evaluation,  At surgeon's request and post-op pain management  Laterality: Right  Prep: Maximum Sterile Barrier Precautions used, chloraprep       Needles:  Injection technique: Single-shot  Needle Type: Echogenic Stimulator Needle     Needle Length: 9cm  Needle Gauge: 22     Additional Needles:   Procedures:,,,, ultrasound used (permanent image in chart),,,,  Narrative:  Start time: 03/27/2020 7:05 AM End time: 03/27/2020 7:15 AM Injection made incrementally with aspirations every 5 mL.  Performed by: Personally  Anesthesiologist: Lannie Fields, DO  Additional Notes: Monitors applied. No increased pain on injection. No increased resistance to injection. Injection made in 5cc increments. Good needle visualization. Patient tolerated procedure well.

## 2020-03-27 NOTE — Transfer of Care (Signed)
Immediate Anesthesia Transfer of Care Note  Patient: Stephanie Dickson  Procedure(s) Performed: Dione Booze WITH RIA HARVEST (Right )  Patient Location: PACU  Anesthesia Type:General  Level of Consciousness: awake, alert  and oriented  Airway & Oxygen Therapy: Patient Spontanous Breathing and Patient connected to nasal cannula oxygen  Post-op Assessment: Report given to RN, Post -op Vital signs reviewed and stable and Patient moving all extremities  Post vital signs: Reviewed and stable  Last Vitals:  Vitals Value Taken Time  BP 124/54 03/27/20 0935  Temp 36.6 C 03/27/20 0935  Pulse 112 03/27/20 0941  Resp 16 03/27/20 0941  SpO2 100 % 03/27/20 0941  Vitals shown include unvalidated device data.  Last Pain:  Vitals:   03/27/20 0935  TempSrc:   PainSc: (P) Asleep         Complications: No apparent anesthesia complications

## 2020-03-28 ENCOUNTER — Encounter: Payer: Self-pay | Admitting: *Deleted

## 2020-03-28 DIAGNOSIS — T8484XA Pain due to internal orthopedic prosthetic devices, implants and grafts, initial encounter: Secondary | ICD-10-CM | POA: Diagnosis not present

## 2020-03-28 LAB — BASIC METABOLIC PANEL
Anion gap: 12 (ref 5–15)
BUN: 9 mg/dL (ref 6–20)
CO2: 23 mmol/L (ref 22–32)
Calcium: 8.9 mg/dL (ref 8.9–10.3)
Chloride: 106 mmol/L (ref 98–111)
Creatinine, Ser: 0.77 mg/dL (ref 0.44–1.00)
GFR calc Af Amer: >60 mL/min (ref >60)
GFR calc non Af Amer: >60 mL/min (ref >60)
Glucose, Bld: 158 mg/dL — ABNORMAL HIGH (ref 70–99)
Potassium: 3.5 mmol/L (ref 3.5–5.1)
Sodium: 141 mmol/L (ref 135–145)

## 2020-03-28 LAB — CBC
HCT: 36.5 % (ref 36.0–46.0)
Hemoglobin: 12 g/dL (ref 12.0–15.0)
MCH: 29.3 pg (ref 26.0–34.0)
MCHC: 32.9 g/dL (ref 30.0–36.0)
MCV: 89.2 fL (ref 80.0–100.0)
Platelets: 320 10*3/uL (ref 150–400)
RBC: 4.09 MIL/uL (ref 3.87–5.11)
RDW: 12.8 % (ref 11.5–15.5)
WBC: 14.3 10*3/uL — ABNORMAL HIGH (ref 4.0–10.5)
nRBC: 0 % (ref 0.0–0.2)

## 2020-03-28 LAB — VITAMIN D 25 HYDROXY (VIT D DEFICIENCY, FRACTURES): Vit D, 25-Hydroxy: 24.51 ng/mL — ABNORMAL LOW (ref 30–100)

## 2020-03-28 MED ORDER — VITAMIN D 25 MCG (1000 UNIT) PO TABS
2000.0000 [IU] | ORAL_TABLET | Freq: Every day | ORAL | Status: DC
  Administered 2020-03-28 – 2020-03-29 (×2): 2000 [IU] via ORAL
  Filled 2020-03-28 (×2): qty 2
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
## 2020-03-28 NOTE — Progress Notes (Signed)
PT Progress Note for Charges    03/28/20 1000  PT Visit Information  Last PT Received On 03/28/20  PT General Charges  $$ ACUTE PT VISIT 1 Visit  PT Treatments  $Gait Training 8-22 mins  Arletta Bale, DPT  Acute Rehabilitation Services Pager [phone] Office [phone]

## 2020-03-28 NOTE — Progress Notes (Addendum)
Orthopaedic Trauma Progress Note  S: Doing okay.  Popliteal nerve block still in place.  Has not been up out of bed.  Tolerating diet and fluids  O:  Vitals:   03/28/20 0726 03/28/20 1157  BP: (!) 110/54 102/60  Pulse: 89 86  Resp: 20 18  Temp: 98.1 F (36.7 C) 98.2 F (36.8 C)  SpO2: 97% 97%    General: Laying in bed comfortably, no acute distress.  Watching videos on her phone. Respiratory: No increased work of breathing.  Right lower extremity: Dressing in place is clean, dry, intact.  No sensation to below the knee.  Sensation is intact to light touch of the thigh.  Unable to wiggle toes.  Neurovascularly intact  Imaging: Stable post op imaging.   Labs:  Results for orders placed or performed during the hospital encounter of 03/27/20 (from the past 24 hour(s))  VITAMIN D 25 Hydroxy (Vit-D Deficiency, Fractures)     Status: Abnormal   Collection Time: 03/28/20  5:50 AM  Result Value Ref Range   Vit D, 25-Hydroxy 24.51 (L) 30 - 100 ng/mL  Basic metabolic panel     Status: Abnormal   Collection Time: 03/28/20  5:50 AM  Result Value Ref Range   Sodium 141 135 - 145 mmol/L   Potassium 3.5 3.5 - 5.1 mmol/L   Chloride 106 98 - 111 mmol/L   CO2 23 22 - 32 mmol/L   Glucose, Bld 158 (H) 70 - 99 mg/dL   BUN 9 6 - 20 mg/dL   Creatinine, Ser 1.30 0.44 - 1.00 mg/dL   Calcium 8.9 8.9 - 86.5 mg/dL   GFR calc non Af Amer >60 >60 mL/min   GFR calc Af Amer >60 >60 mL/min   Anion gap 12 5 - 15  CBC     Status: Abnormal   Collection Time: 03/28/20  5:50 AM  Result Value Ref Range   WBC 14.3 (H) 4.0 - 10.5 K/uL   RBC 4.09 3.87 - 5.11 MIL/uL   Hemoglobin 12.0 12.0 - 15.0 g/dL   HCT 78.4 69.6 - 29.5 %   MCV 89.2 80.0 - 100.0 fL   MCH 29.3 26.0 - 34.0 pg   MCHC 32.9 30.0 - 36.0 g/dL   RDW 28.4 13.2 - 44.0 %   Platelets 320 150 - 400 K/uL   nRBC 0.0 0.0 - 0.2 %    Assessment: 24 year old female s/p right subtalar fusion, 1 Day Post-Op    Weightbearing: NWB RLE  Insicional and  dressing care: Plan to change dressing tomorrow. Showering: Okay to begin showering on 03/30/2020  Orthopedic device(s): None currently.  Patient has cam boot she will apply when ambulating  CV/Blood loss: Hgb 12.0 this morning. Hemodynamically stable  Pain management:  1. Tylenol 1000 mg q 6 hours scheduled 2. Robaxin 500 mg q 6 hours PRN 3. Oxycodone 5-15 mg q 4 hours PRN 4. Neurontin 100 mg TID 5. Dilaudid 0.5-1 mg q 4 hours PRN  VTE prophylaxis: Aspirin, SCDs  ID:  Ancef 2gm post op completed  Foley/Lines:  No foley, KVO IVFs  Medical co-morbidities: Asthma  Impediments to Fracture Healing: Vitamin D level 24, continue D3 supplementation  Dispo: PT/OT eval today.  Recommended outpatient PT once medically appropriate.  Plan for discharge either today or tomorrow   Follow - up plan: 2 weeks  Contact information:  Truitt Merle MD, Ulyses Southward PA-C   [place] A. Ladonna Snide [facility] [phone] (office) [email or web address]

## 2020-03-28 NOTE — Progress Notes (Signed)
Patient complained of pain to rt foot and heel. States she is beginning to have sensation to rt foot. Able to wiggle toes but cannot feel when touched. Medicated as ordered.

## 2020-03-28 NOTE — Evaluation (Signed)
Occupational Therapy Evaluation Patient Details Name: Stephanie Dickson MRN: [id number] DOB: 16-Nov-1996 Today's Date: 03/28/2020    History of Present Illness Pt is a 24 y/o female s/p subtalar fusion and removal of calcaneal hardware. PMH includes calcaneal fx s/p ORIF, asthma.    Clinical Impression   Pt is functioning modified independent in ADL and ADL transfers. She is well versed in NWB on her R LE from previous surgery. No further OT needs.    Follow Up Recommendations  No OT follow up    Equipment Recommendations  None recommended by OT    Recommendations for Other Services       Precautions / Restrictions Precautions Precautions: Fall Restrictions Weight Bearing Restrictions: Yes RLE Weight Bearing: Non weight bearing      Mobility Bed Mobility Overal bed mobility: Modified Independent             General bed mobility comments: HOB up  Transfers Overall transfer level: Modified independent Equipment used: Rolling walker (2 wheeled)             General transfer comment: mod I with RW    Balance Overall balance assessment: Needs assistance Sitting-balance support: No upper extremity supported;Feet supported Sitting balance-Leahy Scale: Good     Standing balance support: Bilateral upper extremity supported;During functional activity;No upper extremity supported Standing balance-Leahy Scale: Poor Standing balance comment: Reliant on B UE support during dynamic challenges, able to static stand without LOB                           ADL either performed or assessed with clinical judgement   ADL Overall ADL's : Modified independent                                       General ADL Comments: Reinforced compensatory strategies for ADL adhering to NWB precautions.     Vision Baseline Vision/History: No visual deficits Patient Visual Report: No change from baseline       Perception     Praxis      Pertinent  Vitals/Pain Pain Assessment: Faces Faces Pain Scale: Hurts little more Pain Location: head Pain Descriptors / Indicators: Aching Pain Intervention(s): Patient requesting pain meds-RN notified     Hand Dominance Right   Extremity/Trunk Assessment Upper Extremity Assessment Upper Extremity Assessment: Overall WFL for tasks assessed   Lower Extremity Assessment Lower Extremity Assessment: Defer to PT evaluation RLE Deficits / Details: R foot still numb from surgery.    Cervical / Trunk Assessment Cervical / Trunk Assessment: Normal   Communication Communication Communication: No difficulties   Cognition Arousal/Alertness: Awake/alert Behavior During Therapy: WFL for tasks assessed/performed Overall Cognitive Status: Within Functional Limits for tasks assessed                                     General Comments       Exercises     Shoulder Instructions      Home Living Family/patient expects to be discharged to:: Private residence Living Arrangements: Spouse/significant other;Children Available Help at Discharge: Family;Available 24 hours/day Type of Home: House Home Access: Stairs to enter [employer] of Steps: 2 Entrance Stairs-Rails: None Home Layout: One level     Bathroom Shower/Tub: Teacher, early years/pre: Standard  Home Equipment: Walker - 2 wheels;Bedside commode;Wheelchair - manual;Shower seat(knee walker)          Prior Functioning/Environment Level of Independence: Independent with assistive device(s)        Comments: Uses RW vs. knee scooter vs. knee crutch        OT Problem List:        OT Treatment/Interventions:      OT Goals(Current goals can be found in the care plan section) Acute Rehab OT Goals Patient Stated Goal: to go home  OT Frequency:     Barriers to D/C:            Co-evaluation              AM-PAC OT "6 Clicks" Daily Activity     Outcome Measure Help from another  person eating meals?: None Help from another person taking care of personal grooming?: None Help from another person toileting, which includes using toliet, bedpan, or urinal?: None Help from another person bathing (including washing, rinsing, drying)?: None Help from another person to put on and taking off regular upper body clothing?: None Help from another person to put on and taking off regular lower body clothing?: None 6 Click Score: 24   End of Session Equipment Utilized During Treatment: Gait belt;Rolling walker Nurse Communication: Patient requests pain meds  Activity Tolerance: Patient tolerated treatment well Patient left: in bed;with call bell/phone within reach  OT Visit Diagnosis: Other abnormalities of gait and mobility (R26.89)                Time: 6144-3154 OT Time Calculation (min): 16 min Charges:  OT General Charges $OT Visit: 1 Visit OT Evaluation $OT Eval Low Complexity: 1 Low  Martie Round, OTR/L Acute Rehabilitation Services Pager: [phone] Office: [phone]  Evern Bio 03/28/2020, 11:11 AM

## 2020-03-29 DIAGNOSIS — T8484XA Pain due to internal orthopedic prosthetic devices, implants and grafts, initial encounter: Secondary | ICD-10-CM | POA: Diagnosis not present

## 2020-03-29 LAB — BASIC METABOLIC PANEL
Anion gap: 10 (ref 5–15)
BUN: 11 mg/dL (ref 6–20)
CO2: 22 mmol/L (ref 22–32)
Calcium: 8.2 mg/dL — ABNORMAL LOW (ref 8.9–10.3)
Chloride: 108 mmol/L (ref 98–111)
Creatinine, Ser: 0.71 mg/dL (ref 0.44–1.00)
GFR calc Af Amer: >60 mL/min (ref >60)
GFR calc non Af Amer: >60 mL/min (ref >60)
Glucose, Bld: 96 mg/dL (ref 70–99)
Potassium: 3.6 mmol/L (ref 3.5–5.1)
Sodium: 140 mmol/L (ref 135–145)

## 2020-03-29 LAB — CBC
HCT: 34 % — ABNORMAL LOW (ref 36.0–46.0)
Hemoglobin: 11.1 g/dL — ABNORMAL LOW (ref 12.0–15.0)
MCH: 29.8 pg (ref 26.0–34.0)
MCHC: 32.6 g/dL (ref 30.0–36.0)
MCV: 91.2 fL (ref 80.0–100.0)
Platelets: 259 10*3/uL (ref 150–400)
RBC: 3.73 MIL/uL — ABNORMAL LOW (ref 3.87–5.11)
RDW: 13.2 % (ref 11.5–15.5)
WBC: 9.6 10*3/uL (ref 4.0–10.5)
nRBC: 0 % (ref 0.0–0.2)

## 2020-03-29 MED ORDER — METHOCARBAMOL 500 MG PO TABS: 500.0000 mg | ORAL_TABLET | Freq: Four times a day (QID) | ORAL | 0 refills | Status: AC | PRN

## 2020-03-29 MED ORDER — OXYCODONE-ACETAMINOPHEN 5-325 MG PO TABS: 1.0000 | ORAL_TABLET | Freq: Four times a day (QID) | ORAL | 0 refills | Status: DC | PRN

## 2020-03-29 MED ORDER — GABAPENTIN 100 MG PO CAPS: 100.0000 mg | ORAL_CAPSULE | Freq: Three times a day (TID) | ORAL | 0 refills | Status: AC

## 2020-03-29 NOTE — Progress Notes (Signed)
Patient is discharged from room 3C08 at this time. Alert and in stable condition. IV site d/c'[d and instructions read to patient and spouse with understanding verbalized. Left unit via wheelchair with all belongings at side. 

## 2020-03-29 NOTE — Discharge Summary (Signed)
Orthopaedic Trauma Service (OTS) Discharge Summary   Patient ID: Stephanie Dickson MRN: [id number] DOB/AGE: March 27, 1996 23 y.o.  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 CPT 28725-Right subtalar fusion 2. CPT 20680-Removal of hardware right calcaneus  Discharged Condition: good  Hospital Course: Patient presented to the hospital on 03/27/2020 for scheduled procedure.  Was taken to the operating room by Dr. Doreatha Martin and underwent the above procedure and tolerated this well without complications.  Was admitted to the hospital for observation and pain control.  Was instructed to be nonweightbearing on the right lower extremity postoperatively.  Was started on Lovenox for DVT prophylaxis starting on postoperative day #1.  Began working with physical and occupational therapy starting on postoperative day #1 and did well maintaining nonweightbearing status on the right lower extremity while ambulating. On 03/29/2020, the patient was tolerating diet, working well with therapies, pain well controlled, vital signs stable, dressings clean, dry, intact and felt stable for discharge to  home. Patient will follow up as below and knows to call with questions or concerns.     Consults: None  Significant Diagnostic Studies:   Results for orders placed or performed during the hospital encounter of 03/27/20 (from the past 168 hour(s))  Pregnancy, urine POC   Collection Time: 03/27/20   6:16 AM  Result Value Ref Range   Preg Test, Ur NEGATIVE NEGATIVE  VITAMIN D 25 Hydroxy (Vit-D Deficiency, Fractures)   Collection Time: 03/28/20  5:50 AM  Result Value Ref Range   Vit D, 25-Hydroxy 24.51 (L) 30 - 100 ng/mL  Basic metabolic panel   Collection Time: 03/28/20  5:50 AM  Result Value Ref Range   Sodium 141 135 - 145 mmol/L   Potassium 3.5 3.5 - 5.1 mmol/L   Chloride 106 98 - 111 mmol/L   CO2 23 22 - 32 mmol/L   Glucose, Bld 158 (H) 70 - 99 mg/dL   BUN 9 6 - 20 mg/dL   Creatinine, Ser 0.77 0.44 - 1.00 mg/dL   Calcium 8.9 8.9 - 10.3 mg/dL   GFR calc non Af Amer >60 >60 mL/min   GFR calc Af Amer >60 >60 mL/min   Anion gap 12 5 - 15  CBC   Collection Time: 03/28/20  5:50 AM  Result Value Ref Range   WBC 14.3 (H) 4.0 - 10.5 K/uL   RBC 4.09 3.87 - 5.11 MIL/uL   Hemoglobin 12.0 12.0 - 15.0 g/dL   HCT 36.5 36.0 - 46.0 %   MCV 89.2 80.0 - 100.0 fL   MCH 29.3 26.0 - 34.0 pg   MCHC 32.9 30.0 - 36.0 g/dL   RDW 12.8 11.5 - 15.5 %   Platelets 320 150 - 400 K/uL   nRBC 0.0 0.0 - 0.2 %  Basic metabolic panel   Collection Time: 03/29/20  5:20 AM  Result Value Ref Range   Sodium 140 135 - 145 mmol/L   Potassium 3.6 3.5 - 5.1 mmol/L  Chloride 108 98 - 111 mmol/L   CO2 22 22 - 32 mmol/L   Glucose, Bld 96 70 - 99 mg/dL   BUN 11 6 - 20 mg/dL   Creatinine, Ser 8.65 0.44 - 1.00 mg/dL   Calcium 8.2 (L) 8.9 - 10.3 mg/dL   GFR calc non Af Amer >60 >60 mL/min   GFR calc Af Amer >60 >60 mL/min   Anion gap 10 5 - 15  CBC   Collection Time: 03/29/20  5:20 AM  Result Value Ref Range   WBC 9.6 4.0 - 10.5 K/uL   RBC 3.73 (L) 3.87 - 5.11 MIL/uL   Hemoglobin 11.1 (L) 12.0 - 15.0 g/dL   HCT 78.4 (L) 69.6 - 29.5 %   MCV 91.2 80.0 - 100.0 fL   MCH 29.8 26.0 - 34.0 pg   MCHC 32.6 30.0 - 36.0 g/dL   RDW 28.4 13.2 - 44.0 %   Platelets 259 150 - 400 K/uL   nRBC 0.0 0.0 - 0.2 %  Results for orders placed or performed during the hospital encounter of 03/25/20 (from the past 168 hour(s))   SARS CORONAVIRUS 2 (TAT 6-24 HRS) Nasopharyngeal Nasopharyngeal Swab   Collection Time: 03/25/20  2:57 PM   Specimen: Nasopharyngeal Swab  Result Value Ref Range   SARS Coronavirus 2 NEGATIVE NEGATIVE  Results for orders placed or performed during the hospital encounter of 03/25/20 (from the past 168 hour(s))  Surgical pcr screen   Collection Time: 03/25/20  2:04 PM   Specimen: Nasal Mucosa; Nasal Swab  Result Value Ref Range   MRSA, PCR NEGATIVE NEGATIVE   Staphylococcus aureus NEGATIVE NEGATIVE  CBC WITH DIFFERENTIAL   Collection Time: 03/25/20  2:05 PM  Result Value Ref Range   WBC 7.0 4.0 - 10.5 K/uL   RBC 4.22 3.87 - 5.11 MIL/uL   Hemoglobin 12.7 12.0 - 15.0 g/dL   HCT 10.2 72.5 - 36.6 %   MCV 90.5 80.0 - 100.0 fL   MCH 30.1 26.0 - 34.0 pg   MCHC 33.2 30.0 - 36.0 g/dL   RDW 44.0 34.7 - 42.5 %   Platelets 307 150 - 400 K/uL   nRBC 0.0 0.0 - 0.2 %   Neutrophils Relative % 64 %   Neutro Abs 4.5 1.7 - 7.7 K/uL   Lymphocytes Relative 25 %   Lymphs Abs 1.8 0.7 - 4.0 K/uL   Monocytes Relative 9 %   Monocytes Absolute 0.6 0.1 - 1.0 K/uL   Eosinophils Relative 1 %   Eosinophils Absolute 0.1 0.0 - 0.5 K/uL   Basophils Relative 1 %   Basophils Absolute 0.1 0.0 - 0.1 K/uL   Immature Granulocytes 0 %   Abs Immature Granulocytes 0.01 0.00 - 0.07 K/uL     Treatments: IV hydration, antibiotics: Ancef, analgesia: acetaminophen, Dilaudid and oxycodone, anticoagulation: LMW heparin, therapies: PT and OT and surgery: Right subtalar fusion  Discharge Exam: General: Laying in bed comfortably, no acute distress.  Pleasant and cooperative Respiratory: No increased work of breathing at rest Right lower extremity: Dressing changed, incision to lateral foot is clean, dry, intact.  Eschar medial ankle stable.  Ankle dorsiflexion plantarflexion is intact.  Sensation is intact to light touch throughout the foot.  Neurovascularly intact.  Disposition: Discharge disposition: 01-Home or Self  Care        Allergies as of 03/29/2020   No Known Allergies     Medication List    STOP taking these medications   acetaminophen 500 MG  tablet Commonly known as: TYLENOL   docusate sodium 100 MG capsule Commonly known as: COLACE   oxyCODONE 5 MG immediate release tablet Commonly known as: Oxy IR/ROXICODONE   Oxycodone HCl 10 MG Tabs   polyethylene glycol 17 g packet Commonly known as: MIRALAX / GLYCOLAX   polymixin-bacitracin 500-10000 UNIT/GM Oint ointment   polyvinyl alcohol 1.4 % ophthalmic solution Commonly known as: LIQUIFILM TEARS   Vitamin D (Ergocalciferol) 1.25 MG (50000 UNIT) Caps capsule Commonly known as: DRISDOL     TAKE these medications   ascorbic acid 500 MG tablet Commonly known as: VITAMIN C Take 1 tablet (500 mg total) by mouth daily.   gabapentin 100 MG capsule Commonly known as: NEURONTIN Take 1 capsule (100 mg total) by mouth 3 (three) times daily. What changed:   medication strength  how much to take   methocarbamol 500 MG tablet Commonly known as: ROBAXIN Take 1 tablet (500 mg total) by mouth every 6 (six) hours as needed for muscle spasms. What changed:   how much to take  when to take this  reasons to take this   oxyCODONE-acetaminophen 5-325 MG tablet Commonly known as: Percocet Take 1 tablet by mouth every 6 (six) hours as needed for severe pain.   Vitamin D-3 125 MCG (5000 UT) Tabs Take 5,000 Units by mouth daily.        Discharge Instructions and Plan: Patient will be discharged to home. Will be discharged on Aspirin 325 mg daily for DVT prophylaxis. Patient has all the necessary DME for discharge. Patient will follow up with Dr. Jena Gauss in 2 weeks for repeat x-rays and suture removal.   Signed:  Shawn Route. Ladonna Snide ?[phone]? (phone) 03/29/2020, 11:49 AM  [facility] [address] [place] [address] [phone] (F)

## 2020-03-29 NOTE — Progress Notes (Signed)
PT Progress Note for Charges    03/29/20 1121  PT Visit Information  Last PT Received On 03/29/20  PT General Charges  $$ ACUTE PT VISIT 1 Visit  PT Treatments  $Therapeutic Exercise 8-22 mins  Arletta Bale, DPT  Acute Rehabilitation Services Pager [phone] Office [phone]

## 2020-03-29 NOTE — TOC Transition Note (Signed)
Transition of Care [facility]) - CM/SW Discharge Note   Patient Details  Name: Stephanie Dickson MRN: [id number] Date of Birth: 20-Aug-1996  Transition of [facility]) CM/SW Contact:  Beckie Busing, RN Phone Number: [phone] 03/29/2020, 10:24 AM   Clinical Narrative:  CM at the bedside to discuss discharge medications with the patient. CM has called [facility] and verified that total medication cost is $58.51. Patient has been made aware that MATCH could cover the total cost of meds. Patient and spouse both agreed that they are able to afford the price of these medications and they do not wish to wait for MATCH to be processed. [facility] is not a Technical sales engineer. There are no further needs noted CM will sign off.    Final next level of care: Home/Self Care Barriers to Discharge: No Barriers Identified   Patient Goals and CMS Choice Patient states their goals for this hospitalization and ongoing recovery are:: Wants to go home   Choice offered to / list presented to : NA  Discharge Placement                       Discharge Plan and Services In-house Referral: NA Discharge Planning Services: NA Post Acute Care Choice: NA          DME Arranged: N/A DME Agency: NA       HH Arranged: NA          Social Determinants of Health (SDOH) Interventions     Readmission Risk Interventions No flowsheet data found.
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
## 2020-03-29 NOTE — Progress Notes (Signed)
Physical Therapy Treatment Patient Details Name: Stephanie Dickson MRN: [id number] DOB: [birth year] Today's Date: 03/29/2020    History of Present Illness Pt is a 24 y/o female s/p subtalar fusion and removal of calcaneal hardware. PMH includes calcaneal fx s/p ORIF, asthma.     PT Comments    Patient continues to progress with mobility and activity tolerance. Session focused on education and strengthening exercises in standing. Pt demonstrated proper exercise technique and maintained R LE NWB status throughout. Educated pt and pt's husband on AAROM for R LE once medically appropriate, reviewed stair navigation with pt and pt's husband- both verbalized understanding. Recommend d/c home with family support and OP PT once medically appropriate. Pt would continue to benefit from skilled physical therapy services at this time while admitted and after d/c to address the below listed limitations in order to improve overall safety and independence with functional mobility.   Follow Up Recommendations  Outpatient PT     Equipment Recommendations  None recommended by PT    Recommendations for Other Services       Precautions / Restrictions Precautions Precautions: Fall Restrictions Weight Bearing Restrictions: Yes RLE Weight Bearing: Non weight bearing    Mobility  Bed Mobility Overal bed mobility: Modified Independent                Transfers Overall transfer level: Modified independent Equipment used: Rolling walker (2 wheeled)             General transfer comment: mod I with RW, no LOB noted  Ambulation/Gait                 Stairs         General stair comments: verball reviewed stair navigation with pt and pt's husband-- both verbalized understanding   Wheelchair Mobility    Modified Rankin (Stroke Patients Only)       Balance Overall balance assessment: Needs assistance Sitting-balance support: No upper extremity supported;Feet  supported Sitting balance-Leahy Scale: Good     Standing balance support: Bilateral upper extremity supported;During functional activity;No upper extremity supported Standing balance-Leahy Scale: Poor Standing balance comment: Reliant on B UE support during dynamic challenges, able to static stand without LOB                            Cognition Arousal/Alertness: Awake/alert Behavior During Therapy: WFL for tasks assessed/performed Overall Cognitive Status: Within Functional Limits for tasks assessed                                        Exercises General Exercises - Lower Extremity Ankle Circles/Pumps: AROM;Right;5 reps;Standing(limited ROM) Hip ABduction/ADduction: Strengthening;Right;10 reps;Standing Hip Flexion/Marching: Strengthening;Right;10 reps;Standing Toe Raises: Other (comment)(educated pt on, once medically appropriate) Other Exercises Other Exercises: Hip ext; R LE; strengthening; 10 reps, standing Other Exercises: Educated on AAROM and stretching into DF once medically appropraite Other Exercises: Educated on use of stairs for DF ROM once medically appropriate    General Comments General comments (skin integrity, edema, etc.): pt's husband present throughout session.       Pertinent Vitals/Pain Pain Assessment: Faces Faces Pain Scale: Hurts a little bit Pain Location: R LE Pain Descriptors / Indicators: Aching;Discomfort;Pins and needles Pain Intervention(s): Limited activity within patient's tolerance;Monitored during session    Home Living  Prior Function            PT Goals (current goals can now be found in the care plan section) Acute Rehab PT Goals Patient Stated Goal: to go home PT Goal Formulation: With patient Time For Goal Achievement: 04/10/20 Potential to Achieve Goals: Good Progress towards PT goals: Progressing toward goals    Frequency    Min 5X/week      PT Plan Current  plan remains appropriate    Co-evaluation              AM-PAC PT "6 Clicks" Mobility   Outcome Measure  Help needed turning from your back to your side while in a flat bed without using bedrails?: None Help needed moving from lying on your back to sitting on the side of a flat bed without using bedrails?: None Help needed moving to and from a bed to a chair (including a wheelchair)?: None Help needed standing up from a chair using your arms (e.g., wheelchair or bedside chair)?: None Help needed to walk in hospital room?: None Help needed climbing 3-5 steps with a railing? : A Little 6 Click Score: 23    End of Session   Activity Tolerance: Patient tolerated treatment well Patient left: in bed;with call bell/phone within reach;with family/visitor present Nurse Communication: Mobility status PT Visit Diagnosis: Other abnormalities of gait and mobility (R26.89)     Time: 0955-1005 PT Time Calculation (min) (ACUTE ONLY): 10 min  Charges:  $Therapeutic Exercise: 8-22 mins                     Stephanie Dickson, SPT Acute Rehab  [phone]    Stephanie Dickson 03/29/2020, 11:25 AM

## 2020-09-11 ENCOUNTER — Other Ambulatory Visit: Payer: Self-pay | Admitting: Student

## 2020-09-11 DIAGNOSIS — S92061D Displaced intraarticular fracture of right calcaneus, subsequent encounter for fracture with routine healing: Secondary | ICD-10-CM

## 2020-09-26 ENCOUNTER — Ambulatory Visit
Admission: RE | Admit: 2020-09-26 | Discharge: 2020-09-26 | Disposition: A | Discharge status: Home / Self Care | Payer: Medicaid Other | Source: Ambulatory Visit | Attending: Student | Admitting: Student

## 2020-09-26 DIAGNOSIS — S92061D Displaced intraarticular fracture of right calcaneus, subsequent encounter for fracture with routine healing: Secondary | ICD-10-CM

## 2020-10-08 ENCOUNTER — Other Ambulatory Visit: Payer: Self-pay | Admitting: Student

## 2020-10-08 DIAGNOSIS — M25571 Pain in right ankle and joints of right foot: Secondary | ICD-10-CM

## 2020-10-16 ENCOUNTER — Ambulatory Visit
Admission: RE | Admit: 2020-10-16 | Discharge: 2020-10-16 | Disposition: A | Discharge status: Home / Self Care | Payer: Medicaid Other | Source: Ambulatory Visit | Attending: Student | Admitting: Student

## 2020-10-16 ENCOUNTER — Other Ambulatory Visit: Payer: Self-pay

## 2020-10-16 DIAGNOSIS — M25571 Pain in right ankle and joints of right foot: Secondary | ICD-10-CM

## 2020-10-16 MED ORDER — METHYLPREDNISOLONE ACETATE 40 MG/ML INJ SUSP (RADIOLOG
120.0000 mg | Freq: Once | INTRAMUSCULAR | Status: AC
  Administered 2020-10-16: 80 mg via EPIDURAL

## 2020-10-16 MED ORDER — IOPAMIDOL (ISOVUE-M 200) INJECTION 41%
1.0000 mL | Freq: Once | INTRAMUSCULAR | Status: AC
  Administered 2020-10-16: 1 mL via INTRA_ARTICULAR

## 2021-07-07 ENCOUNTER — Other Ambulatory Visit: Payer: Self-pay | Admitting: Orthopaedic Surgery

## 2021-07-25 NOTE — Progress Notes (Signed)
Surgical Instructions    Your procedure is scheduled on 08/05/21.  Report to [facility] Main Entrance "A" at 5:30 A.M., then check in with the Admitting office.  Call this number if you have problems the morning of surgery:  [phone]   If you have any questions prior to your surgery date call [phone]: Open Monday-Friday 8am-4pm    Remember:  Do not eat after midnight the night before your surgery  You may drink clear liquids until 4:30 the morning of your surgery.   Clear liquids allowed are: Water, Non-Citrus Juices (without pulp), Carbonated Beverages, Clear Tea, Black Coffee ONLY (NO MILK, CREAM OR POWDERED CREAMER of any kind), and Gatorade    Take these medicines the morning of surgery with A SIP OF WATER:  None     As of today, STOP taking any Aspirin (unless otherwise instructed by your surgeon) Aleve, Naproxen, Ibuprofen, Motrin, Advil, Goody's, BC's, all herbal medications, fish oil, and all vitamins.          Do not wear jewelry or makeup Do not wear lotions, powders, perfumes or deodorant. Do not shave 48 hours prior to surgery.   Do not bring valuables to the hospital. DO Not wear nail polish, gel polish, artificial nails, or any other type of covering on  natural nails including finger and toenails. If patients have artificial nails, gel coating, etc. that need to be removed by a nail salon please have this removed prior to surgery or surgery may need to be canceled/delayed if the surgeon/ anesthesia feels like the patient is unable to be adequately monitored.             [facility] [facility] is not responsible for any belongings or valuables.  Do NOT Smoke (Tobacco/Vaping) or drink Alcohol 24 hours prior to your procedure If you use a CPAP at night, you may bring all equipment for your overnight stay.   Contacts, glasses, dentures or bridgework may not be worn into surgery, please bring cases for these belongings   For patients admitted to the hospital, discharge time  will be determined by your treatment team.   Patients discharged the day of surgery will not be allowed to drive home, and someone needs to stay with them for 24 hours.  ONLY 1 SUPPORT PERSON MAY BE PRESENT WHILE YOU ARE IN SURGERY. IF YOU ARE TO BE ADMITTED ONCE YOU ARE IN YOUR ROOM YOU WILL BE ALLOWED TWO (2) VISITORS.  Minor children may have two parents present. Special consideration for safety and communication needs will be reviewed on a case by case basis.  Special instructions:    Oral Hygiene is also important to reduce your risk of infection.  Remember - BRUSH YOUR TEETH THE MORNING OF SURGERY WITH YOUR REGULAR TOOTHPASTE   [place]- Preparing For Surgery  Before surgery, you can play an important role. Because skin is not sterile, your skin needs to be as free of germs as possible. You can reduce the number of germs on your skin by washing with CHG (chlorahexidine gluconate) Soap before surgery.  CHG is an antiseptic cleaner which kills germs and bonds with the skin to continue killing germs even after washing.     Please do not use if you have an allergy to CHG or antibacterial soaps. If your skin becomes reddened/irritated stop using the CHG.  Do not shave (including legs and underarms) for at least 48 hours prior to first CHG shower. It is OK to shave your face.  Please follow these instructions carefully.     Shower the NIGHT BEFORE SURGERY and the MORNING OF SURGERY with CHG Soap.   If you chose to wash your hair, wash your hair first as usual with your normal shampoo. After you shampoo, rinse your hair and body thoroughly to remove the shampoo.  Then [employer] and genitals (private parts) with your normal soap and rinse thoroughly to remove soap.  After that Use CHG Soap as you would any other liquid soap. You can apply CHG directly to the skin and wash gently with a scrungie or a clean washcloth.   Apply the CHG Soap to your body ONLY FROM THE NECK DOWN.  Do not use on  open wounds or open sores. Avoid contact with your eyes, ears, mouth and genitals (private parts). Wash Face and genitals (private parts)  with your normal soap.   Wash thoroughly, paying special attention to the area where your surgery will be performed.  Thoroughly rinse your body with warm water from the neck down.  DO NOT shower/wash with your normal soap after using and rinsing off the CHG Soap.  Pat yourself dry with a CLEAN TOWEL.  Wear CLEAN PAJAMAS to bed the night before surgery  Place CLEAN SHEETS on your bed the night before your surgery  DO NOT SLEEP WITH PETS.   Day of Surgery:  Take a shower with CHG soap. Wear Clean/Comfortable clothing the morning of surgery Do not apply any deodorants/lotions.   Remember to brush your teeth WITH YOUR REGULAR TOOTHPASTE.   Please read over the following fact sheets that you were given.

## 2021-07-28 ENCOUNTER — Other Ambulatory Visit: Payer: Self-pay

## 2021-07-28 ENCOUNTER — Encounter (HOSPITAL_COMMUNITY): Payer: Self-pay

## 2021-07-28 ENCOUNTER — Encounter (HOSPITAL_COMMUNITY)
Admission: RE | Admit: 2021-07-28 | Discharge: 2021-07-28 | Disposition: A | Discharge status: Home / Self Care | Payer: Medicaid Other | Source: Ambulatory Visit | Attending: Orthopaedic Surgery | Admitting: Orthopaedic Surgery

## 2021-07-28 DIAGNOSIS — Z01812 Encounter for preprocedural laboratory examination: Secondary | ICD-10-CM | POA: Diagnosis present

## 2021-07-28 HISTORY — DX: Headache, unspecified: R51.9

## 2021-07-28 LAB — CBC
HCT: 40.2 % (ref 36.0–46.0)
Hemoglobin: 13.2 g/dL (ref 12.0–15.0)
MCH: 29.3 pg (ref 26.0–34.0)
MCHC: 32.8 g/dL (ref 30.0–36.0)
MCV: 89.1 fL (ref 80.0–100.0)
Platelets: 284 10*3/uL (ref 150–400)
RBC: 4.51 MIL/uL (ref 3.87–5.11)
RDW: 13.1 % (ref 11.5–15.5)
WBC: 8.6 10*3/uL (ref 4.0–10.5)
nRBC: 0 % (ref 0.0–0.2)

## 2021-07-28 LAB — BASIC METABOLIC PANEL
Anion gap: 6 (ref 5–15)
BUN: 9 mg/dL (ref 6–20)
CO2: 26 mmol/L (ref 22–32)
Calcium: 8.6 mg/dL — ABNORMAL LOW (ref 8.9–10.3)
Chloride: 108 mmol/L (ref 98–111)
Creatinine, Ser: 0.84 mg/dL (ref 0.44–1.00)
GFR, Estimated: >60 mL/min (ref >60)
Glucose, Bld: 115 mg/dL — ABNORMAL HIGH (ref 70–99)
Potassium: 3.5 mmol/L (ref 3.5–5.1)
Sodium: 140 mmol/L (ref 135–145)

## 2021-07-28 LAB — SURGICAL PCR SCREEN
MRSA, PCR: NEGATIVE
Staphylococcus aureus: NEGATIVE

## 2021-07-28 NOTE — Progress Notes (Signed)
PCP: Eunice Blase PA-C Cardiologist: Kingsley Callander  EKG: 01/26/20 CXR: 01/26/20 ECHO: 09/30/20 Stress Test:denies Cardiac Cath: denies  Fasting Blood Sugar- na Checks Blood Sugar_na__ times a day  OSA/CPAP: No  ASA/Blood Thinner: No  Covid test 08/01/21.  Gave instructions, map and requisition to patient.  Anesthesia Review: No  Patient denies shortness of breath, fever, cough, and chest pain at PAT appointment.  Patient verbalized understanding of instructions provided today at the PAT appointment.  Patient asked to review instructions at home and day of surgery.

## 2021-08-01 ENCOUNTER — Other Ambulatory Visit: Payer: Self-pay | Admitting: Orthopaedic Surgery

## 2021-08-02 LAB — SARS CORONAVIRUS 2 (TAT 6-24 HRS): SARS Coronavirus 2: NEGATIVE

## 2021-08-05 ENCOUNTER — Ambulatory Visit (HOSPITAL_COMMUNITY): Payer: Medicaid Other

## 2021-08-05 ENCOUNTER — Ambulatory Visit (HOSPITAL_COMMUNITY)
Admission: RE | Admit: 2021-08-05 | Discharge: 2021-08-06 | Disposition: A | Discharge status: Home / Self Care | Payer: Medicaid Other | Source: Ambulatory Visit | Attending: Orthopaedic Surgery | Admitting: Orthopaedic Surgery

## 2021-08-05 ENCOUNTER — Encounter (HOSPITAL_COMMUNITY): Payer: Self-pay | Admitting: Orthopaedic Surgery

## 2021-08-05 ENCOUNTER — Encounter (HOSPITAL_COMMUNITY)
Admission: RE | Disposition: A | Discharge status: Home / Self Care | Payer: Self-pay | Source: Ambulatory Visit | Attending: Orthopaedic Surgery

## 2021-08-05 ENCOUNTER — Ambulatory Visit (HOSPITAL_COMMUNITY): Payer: Medicaid Other | Admitting: Anesthesiology

## 2021-08-05 DIAGNOSIS — Z419 Encounter for procedure for purposes other than remedying health state, unspecified: Secondary | ICD-10-CM

## 2021-08-05 DIAGNOSIS — E7800 Pure hypercholesterolemia, unspecified: Secondary | ICD-10-CM | POA: Diagnosis not present

## 2021-08-05 DIAGNOSIS — S92001A Unspecified fracture of right calcaneus, initial encounter for closed fracture: Secondary | ICD-10-CM | POA: Diagnosis not present

## 2021-08-05 DIAGNOSIS — Q6689 Other  specified congenital deformities of feet: Secondary | ICD-10-CM | POA: Diagnosis not present

## 2021-08-05 DIAGNOSIS — M79673 Pain in unspecified foot: Secondary | ICD-10-CM | POA: Diagnosis present

## 2021-08-05 DIAGNOSIS — Z87442 Personal history of urinary calculi: Secondary | ICD-10-CM | POA: Diagnosis not present

## 2021-08-05 DIAGNOSIS — X58XXXA Exposure to other specified factors, initial encounter: Secondary | ICD-10-CM | POA: Diagnosis not present

## 2021-08-05 DIAGNOSIS — M25371 Other instability, right ankle: Secondary | ICD-10-CM | POA: Insufficient documentation

## 2021-08-05 HISTORY — PX: FOOT ARTHRODESIS: SHX1655

## 2021-08-05 LAB — POCT PREGNANCY, URINE: Preg Test, Ur: NEGATIVE

## 2021-08-05 SURGERY — FUSION, JOINT, FOOT
Anesthesia: Regional | Site: Foot | Laterality: Right

## 2021-08-05 MED ORDER — ONDANSETRON HCL 4 MG PO TABS: 4.0000 mg | ORAL_TABLET | Freq: Four times a day (QID) | ORAL | Status: DC | PRN

## 2021-08-05 MED ORDER — LACTATED RINGERS IV SOLN: INTRAVENOUS | Status: DC

## 2021-08-05 MED ORDER — FENTANYL CITRATE (PF) 250 MCG/5ML IJ SOLN
INTRAMUSCULAR | Status: AC
  Filled 2021-08-05: qty 5

## 2021-08-05 MED ORDER — ONDANSETRON HCL 4 MG/2ML IJ SOLN: 4.0000 mg | Freq: Four times a day (QID) | INTRAMUSCULAR | Status: DC | PRN

## 2021-08-05 MED ORDER — SCOPOLAMINE 1 MG/3DAYS TD PT72
MEDICATED_PATCH | TRANSDERMAL | Status: AC
  Filled 2021-08-05: qty 1

## 2021-08-05 MED ORDER — HYDROCODONE-ACETAMINOPHEN 7.5-325 MG PO TABS: 1.0000 | ORAL_TABLET | ORAL | Status: DC | PRN

## 2021-08-05 MED ORDER — SCOPOLAMINE 1 MG/3DAYS TD PT72
1.0000 | MEDICATED_PATCH | TRANSDERMAL | Status: DC
  Administered 2021-08-05: 1 via TRANSDERMAL

## 2021-08-05 MED ORDER — PHENYLEPHRINE 40 MCG/ML (10ML) SYRINGE FOR IV PUSH (FOR BLOOD PRESSURE SUPPORT)
PREFILLED_SYRINGE | INTRAVENOUS | Status: AC
  Filled 2021-08-05: qty 10

## 2021-08-05 MED ORDER — DEXAMETHASONE SODIUM PHOSPHATE 10 MG/ML IJ SOLN
INTRAMUSCULAR | Status: DC | PRN
  Administered 2021-08-05: 10 mg

## 2021-08-05 MED ORDER — ACETAMINOPHEN 500 MG PO TABS
500.0000 mg | ORAL_TABLET | Freq: Four times a day (QID) | ORAL | Status: AC
  Administered 2021-08-05 – 2021-08-06 (×4): 500 mg via ORAL
  Filled 2021-08-05 (×4): qty 1

## 2021-08-05 MED ORDER — PROMETHAZINE HCL 25 MG/ML IJ SOLN: 6.2500 mg | INTRAMUSCULAR | Status: DC | PRN

## 2021-08-05 MED ORDER — DEXAMETHASONE SODIUM PHOSPHATE 10 MG/ML IJ SOLN
INTRAMUSCULAR | Status: AC
  Filled 2021-08-05: qty 1

## 2021-08-05 MED ORDER — PROPOFOL 10 MG/ML IV BOLUS
INTRAVENOUS | Status: DC | PRN
  Administered 2021-08-05: 200 mg via INTRAVENOUS

## 2021-08-05 MED ORDER — CEFAZOLIN SODIUM-DEXTROSE 2-4 GM/100ML-% IV SOLN
2.0000 g | INTRAVENOUS | Status: AC
Start: 2021-08-05 — End: 2021-08-05
  Administered 2021-08-05: 2 g via INTRAVENOUS
  Filled 2021-08-05: qty 100

## 2021-08-05 MED ORDER — METHOCARBAMOL 500 MG PO TABS: 500.0000 mg | ORAL_TABLET | Freq: Four times a day (QID) | ORAL | Status: DC | PRN

## 2021-08-05 MED ORDER — ROPIVACAINE HCL 5 MG/ML IJ SOLN
INTRAMUSCULAR | Status: DC | PRN
  Administered 2021-08-05: 40 mL via PERINEURAL

## 2021-08-05 MED ORDER — HYDROCODONE-ACETAMINOPHEN 5-325 MG PO TABS
1.0000 | ORAL_TABLET | ORAL | Status: DC | PRN
  Administered 2021-08-05: 1 via ORAL
  Filled 2021-08-05: qty 1

## 2021-08-05 MED ORDER — HYDROMORPHONE HCL 1 MG/ML IJ SOLN: 0.2500 mg | INTRAMUSCULAR | Status: DC | PRN

## 2021-08-05 MED ORDER — MIDAZOLAM HCL 5 MG/5ML IJ SOLN
INTRAMUSCULAR | Status: DC | PRN
  Administered 2021-08-05: 2 mg via INTRAVENOUS

## 2021-08-05 MED ORDER — LIDOCAINE HCL (CARDIAC) PF 100 MG/5ML IV SOSY
PREFILLED_SYRINGE | INTRAVENOUS | Status: DC | PRN
  Administered 2021-08-05: 40 mg via INTRATRACHEAL

## 2021-08-05 MED ORDER — ONDANSETRON HCL 4 MG/2ML IJ SOLN
INTRAMUSCULAR | Status: AC
  Filled 2021-08-05: qty 2

## 2021-08-05 MED ORDER — ACETAMINOPHEN 500 MG PO TABS: 1000.0000 mg | ORAL_TABLET | Freq: Once | ORAL | Status: DC

## 2021-08-05 MED ORDER — BUPIVACAINE-EPINEPHRINE 0.5% -1:200000 IJ SOLN
INTRAMUSCULAR | Status: AC
  Filled 2021-08-05: qty 1

## 2021-08-05 MED ORDER — CHLORHEXIDINE GLUCONATE 0.12 % MT SOLN
15.0000 mL | Freq: Once | OROMUCOSAL | Status: AC
  Administered 2021-08-05: 15 mL via OROMUCOSAL
  Filled 2021-08-05: qty 15

## 2021-08-05 MED ORDER — OXYCODONE HCL 5 MG/5ML PO SOLN: 5.0000 mg | Freq: Once | ORAL | Status: DC | PRN

## 2021-08-05 MED ORDER — AMISULPRIDE (ANTIEMETIC) 5 MG/2ML IV SOLN
INTRAVENOUS | Status: AC
  Filled 2021-08-05: qty 2

## 2021-08-05 MED ORDER — KETOROLAC TROMETHAMINE 30 MG/ML IJ SOLN
INTRAMUSCULAR | Status: AC
  Filled 2021-08-05: qty 1

## 2021-08-05 MED ORDER — DEXAMETHASONE SODIUM PHOSPHATE 4 MG/ML IJ SOLN
INTRAMUSCULAR | Status: DC | PRN
  Administered 2021-08-05: 10 mg via INTRAVENOUS

## 2021-08-05 MED ORDER — FENTANYL CITRATE (PF) 100 MCG/2ML IJ SOLN
INTRAMUSCULAR | Status: DC | PRN
  Administered 2021-08-05: 25 ug via INTRAVENOUS
  Administered 2021-08-05: 100 ug via INTRAVENOUS
  Administered 2021-08-05: 50 ug via INTRAVENOUS
  Administered 2021-08-05 (×3): 25 ug via INTRAVENOUS

## 2021-08-05 MED ORDER — CEFAZOLIN SODIUM-DEXTROSE 1-4 GM/50ML-% IV SOLN
1.0000 g | Freq: Four times a day (QID) | INTRAVENOUS | Status: AC
  Administered 2021-08-05 – 2021-08-06 (×3): 1 g via INTRAVENOUS
  Filled 2021-08-05 (×3): qty 50

## 2021-08-05 MED ORDER — ACETAMINOPHEN 325 MG PO TABS: 325.0000 mg | ORAL_TABLET | Freq: Four times a day (QID) | ORAL | Status: DC | PRN

## 2021-08-05 MED ORDER — 0.9 % SODIUM CHLORIDE (POUR BTL) OPTIME
TOPICAL | Status: DC | PRN
  Administered 2021-08-05: 1000 mL

## 2021-08-05 MED ORDER — DIPHENHYDRAMINE HCL 12.5 MG/5ML PO ELIX
12.5000 mg | ORAL_SOLUTION | ORAL | Status: DC | PRN
  Filled 2021-08-05: qty 10

## 2021-08-05 MED ORDER — DOCUSATE SODIUM 100 MG PO CAPS
100.0000 mg | ORAL_CAPSULE | Freq: Two times a day (BID) | ORAL | Status: DC
  Administered 2021-08-05 – 2021-08-06 (×3): 100 mg via ORAL
  Filled 2021-08-05 (×3): qty 1

## 2021-08-05 MED ORDER — ONDANSETRON HCL 4 MG/2ML IJ SOLN
INTRAMUSCULAR | Status: DC | PRN
  Administered 2021-08-05: 4 mg via INTRAVENOUS

## 2021-08-05 MED ORDER — PROPOFOL 10 MG/ML IV BOLUS
INTRAVENOUS | Status: AC
  Filled 2021-08-05: qty 20

## 2021-08-05 MED ORDER — MORPHINE SULFATE (PF) 2 MG/ML IV SOLN: 0.5000 mg | INTRAVENOUS | Status: DC | PRN

## 2021-08-05 MED ORDER — METHOCARBAMOL 1000 MG/10ML IJ SOLN
500.0000 mg | Freq: Four times a day (QID) | INTRAVENOUS | Status: DC | PRN
  Filled 2021-08-05: qty 5

## 2021-08-05 MED ORDER — METOCLOPRAMIDE HCL 5 MG/ML IJ SOLN: 5.0000 mg | Freq: Three times a day (TID) | INTRAMUSCULAR | Status: DC | PRN

## 2021-08-05 MED ORDER — AMISULPRIDE (ANTIEMETIC) 5 MG/2ML IV SOLN
10.0000 mg | Freq: Once | INTRAVENOUS | Status: AC
  Administered 2021-08-05: 10 mg via INTRAVENOUS

## 2021-08-05 MED ORDER — MEPERIDINE HCL 25 MG/ML IJ SOLN: 6.2500 mg | INTRAMUSCULAR | Status: DC | PRN

## 2021-08-05 MED ORDER — KETOROLAC TROMETHAMINE 30 MG/ML IJ SOLN
INTRAMUSCULAR | Status: DC | PRN
  Administered 2021-08-05: 30 mg via INTRAVENOUS

## 2021-08-05 MED ORDER — KETOROLAC TROMETHAMINE 30 MG/ML IJ SOLN: 30.0000 mg | Freq: Once | INTRAMUSCULAR | Status: DC | PRN

## 2021-08-05 MED ORDER — OXYCODONE HCL 5 MG PO TABS: 5.0000 mg | ORAL_TABLET | Freq: Once | ORAL | Status: DC | PRN

## 2021-08-05 MED ORDER — VANCOMYCIN HCL 500 MG IV SOLR
INTRAVENOUS | Status: AC
  Filled 2021-08-05: qty 10

## 2021-08-05 MED ORDER — SODIUM CHLORIDE 0.9 % IV SOLN
INTRAVENOUS | Status: DC | PRN
  Administered 2021-08-05: 20 ug/min via INTRAVENOUS

## 2021-08-05 MED ORDER — ROCURONIUM BROMIDE 10 MG/ML (PF) SYRINGE
PREFILLED_SYRINGE | INTRAVENOUS | Status: AC
  Filled 2021-08-05: qty 10

## 2021-08-05 MED ORDER — METOCLOPRAMIDE HCL 5 MG PO TABS: 5.0000 mg | ORAL_TABLET | Freq: Three times a day (TID) | ORAL | Status: DC | PRN

## 2021-08-05 MED ORDER — MIDAZOLAM HCL 2 MG/2ML IJ SOLN
INTRAMUSCULAR | Status: AC
  Filled 2021-08-05: qty 2

## 2021-08-05 MED ORDER — NAPROXEN 250 MG PO TABS
250.0000 mg | ORAL_TABLET | Freq: Two times a day (BID) | ORAL | Status: DC
  Administered 2021-08-05 – 2021-08-06 (×2): 250 mg via ORAL
  Filled 2021-08-05 (×2): qty 1

## 2021-08-05 MED ORDER — PHENYLEPHRINE HCL (PRESSORS) 10 MG/ML IV SOLN
INTRAVENOUS | Status: DC | PRN
  Administered 2021-08-05 (×3): 40 ug via INTRAVENOUS
  Administered 2021-08-05: 80 ug via INTRAVENOUS

## 2021-08-05 MED ORDER — ORAL CARE MOUTH RINSE: 15.0000 mL | Freq: Once | OROMUCOSAL | Status: AC

## 2021-08-05 SURGICAL SUPPLY — 65 items
BAG COUNTER SPONGE SURGICOUNT (BAG) ×2 IMPLANT
BANDAGE ESMARK 6X9 LF (GAUZE/BANDAGES/DRESSINGS) IMPLANT
BENZOIN TINCTURE PRP APPL 2/3 (GAUZE/BANDAGES/DRESSINGS) IMPLANT
BIT DRILL 4 MINI HUDSON CANN (BIT) ×2 IMPLANT
BIT DRILL CANN COMP 5.0 (BIT) ×2 IMPLANT
BLADE SAW SAG 73X12.5 (BLADE) ×2 IMPLANT
BLADE SAW SGTL 81X20 HD (BLADE) ×2 IMPLANT
BLADE SURG 15 STRL LF DISP TIS (BLADE) ×5 IMPLANT
BLADE SURG 15 STRL SS (BLADE) ×5
BNDG ELASTIC 6X10 VLCR STRL LF (GAUZE/BANDAGES/DRESSINGS) ×2 IMPLANT
BNDG ESMARK 6X9 LF (GAUZE/BANDAGES/DRESSINGS)
CHLORAPREP W/TINT 26 (MISCELLANEOUS) ×2 IMPLANT
CUFF TOURN SGL QUICK 34 (TOURNIQUET CUFF) ×1
CUFF TRNQT CYL 34X4.125X (TOURNIQUET CUFF) ×1 IMPLANT
DRAPE C-ARM 42X72 X-RAY (DRAPES) ×2 IMPLANT
DRAPE C-ARMOR (DRAPES) IMPLANT
DRAPE EXTREMITY T 121X128X90 (DISPOSABLE) ×2 IMPLANT
DRAPE IMP U-DRAPE 54X76 (DRAPES) ×2 IMPLANT
DRAPE U-SHAPE 47X51 STRL (DRAPES) ×2 IMPLANT
DRSG PAD ABDOMINAL 8X10 ST (GAUZE/BANDAGES/DRESSINGS) IMPLANT
DRSG XEROFORM 1X8 (GAUZE/BANDAGES/DRESSINGS) ×2 IMPLANT
ELECT REM PT RETURN 9FT ADLT (ELECTROSURGICAL) ×2
ELECTRODE REM PT RTRN 9FT ADLT (ELECTROSURGICAL) ×1 IMPLANT
GAUZE SPONGE 4X4 12PLY STRL (GAUZE/BANDAGES/DRESSINGS) ×2 IMPLANT
GAUZE SPONGE 4X4 12PLY STRL LF (GAUZE/BANDAGES/DRESSINGS) ×2 IMPLANT
GLOVE SRG 8 PF TXTR STRL LF DI (GLOVE) ×2 IMPLANT
GLOVE SURG ENC TEXT LTX SZ7.5 (GLOVE) ×2 IMPLANT
GLOVE SURG LTX SZ7.5 (GLOVE) ×2 IMPLANT
GLOVE SURG MICRO LTX SZ7.5 (GLOVE) ×2 IMPLANT
GLOVE SURG UNDER POLY LF SZ7.5 (GLOVE) ×2 IMPLANT
GLOVE SURG UNDER POLY LF SZ8 (GLOVE) ×2
GOWN STRL REUS W/ TWL LRG LVL3 (GOWN DISPOSABLE) ×1 IMPLANT
GOWN STRL REUS W/ TWL XL LVL3 (GOWN DISPOSABLE) ×1 IMPLANT
GOWN STRL REUS W/TWL LRG LVL3 (GOWN DISPOSABLE) ×1
GOWN STRL REUS W/TWL XL LVL3 (GOWN DISPOSABLE) ×1
GUIDEWIRE W/TRCR TIP 2.4X9.25 (WIRE) ×4 IMPLANT
GUIDEWIRE W/TROCAR NT 12IN (WIRE) ×4 IMPLANT
KIT BASIN OR (CUSTOM PROCEDURE TRAY) ×2 IMPLANT
KIT SUTURETAK 2.4 DRILL BIT (KITS) ×2 IMPLANT
NS IRRIG 1000ML POUR BTL (IV SOLUTION) ×2 IMPLANT
PACK ORTHO EXTREMITY (CUSTOM PROCEDURE TRAY) ×2 IMPLANT
PAD CAST 4YDX4 CTTN HI CHSV (CAST SUPPLIES) ×1 IMPLANT
PADDING CAST COTTON 4X4 STRL (CAST SUPPLIES) ×1
PADDING CAST SYNTHETIC 4 (CAST SUPPLIES) ×1
PADDING CAST SYNTHETIC 4X4 STR (CAST SUPPLIES) ×1 IMPLANT
SCREW COMPR FT 7X70 (Screw) ×2 IMPLANT
SCREW LOW PROFILE TIT 6.7X75 (Screw) ×2 IMPLANT
SPLINT PLASTER CAST XFAST 5X30 (CAST SUPPLIES) ×1 IMPLANT
SPLINT PLASTER XFAST SET 5X30 (CAST SUPPLIES) ×1
SPONGE T-LAP 18X18 ~~LOC~~+RFID (SPONGE) ×2 IMPLANT
STRIP CLOSURE SKIN 1/2X4 (GAUZE/BANDAGES/DRESSINGS) IMPLANT
SUCTION FRAZIER HANDLE 10FR (MISCELLANEOUS) ×1
SUCTION TUBE FRAZIER 10FR DISP (MISCELLANEOUS) ×1 IMPLANT
SUT ETHILON 3 0 PS 1 (SUTURE) ×4 IMPLANT
SUT MNCRL AB 3-0 PS2 18 (SUTURE) ×2 IMPLANT
SUT PDS AB 2-0 CT2 27 (SUTURE) ×2 IMPLANT
SUT VIC AB 0 CT1 27 (SUTURE)
SUT VIC AB 0 CT1 27XBRD ANBCTR (SUTURE) IMPLANT
SUT VIC AB 2-0 CT1 27 (SUTURE) ×1
SUT VIC AB 2-0 CT1 TAPERPNT 27 (SUTURE) ×1 IMPLANT
SUTURE TAPE 3.0 DBL LOAD S-TAK (Anchor) ×1 IMPLANT
SUTURETAPE 3.0 DBL LOAD S-TAK (Anchor) ×2 IMPLANT
TOWEL GREEN STERILE FF (TOWEL DISPOSABLE) ×4 IMPLANT
TUBE CONNECTING 20X1/4 (TUBING) ×2 IMPLANT
UNDERPAD 30X36 HEAVY ABSORB (UNDERPADS AND DIAPERS) ×2 IMPLANT

## 2021-08-05 NOTE — Anesthesia Procedure Notes (Signed)
Anesthesia Regional Block: Adductor canal block   Pre-Anesthetic Checklist: , timeout performed,  Correct Patient, Correct Site, Correct Laterality,  Correct Procedure, Correct Position, site marked,  Risks and benefits discussed,  Surgical consent,  Pre-op evaluation,  At surgeon's request and post-op pain management  Laterality: Right  Prep: Maximum Sterile Barrier Precautions used, chloraprep       Needles:  Injection technique: Single-shot  Needle Type: Echogenic Stimulator Needle     Needle Length: 9cm  Needle Gauge: 22     Additional Needles:   Procedures:,,,, ultrasound used (permanent image in chart),,    Narrative:  Start time: 08/05/2021 7:00 AM End time: 08/05/2021 7:05 AM Injection made incrementally with aspirations every 5 mL.  Performed by: Personally  Anesthesiologist: Lannie Fields, DO  Additional Notes: Monitors applied. No increased pain on injection. No increased resistance to injection. Injection made in 5cc increments. Good needle visualization. Patient tolerated procedure well.

## 2021-08-05 NOTE — Transfer of Care (Signed)
Immediate Anesthesia Transfer of Care Note  Patient: Stephanie Dickson  Procedure(s) Performed: REPAIR OF MALUNION, LATERAL LIGAMENT RECONSTRUCTION, PARTIAL RESECTION OF FIBULA, FLEXOR TENOTOMY SECOND THROUGH FIFTH TOES WITH PINNING (Right: Foot)  Patient Location: PACU  Anesthesia Type:General and GA combined with regional for post-op pain  Level of Consciousness: awake, alert , oriented and patient cooperative  Airway & Oxygen Therapy: Patient Spontanous Breathing  Post-op Assessment: Report given to RN, Post -op Vital signs reviewed and stable and Patient moving all extremities X 4  Post vital signs: stable  Last Vitals:  Vitals Value Taken Time  BP 117/73 08/05/21 0945  Temp 36.6 C 08/05/21 0945  Pulse 111 08/05/21 0945  Resp 9 08/05/21 0945  SpO2 99 % 08/05/21 0945  Vitals shown include unvalidated device data.  Last Pain:  Vitals:   08/05/21 0607  TempSrc:   PainSc: 0-No pain         Complications: No notable events documented.

## 2021-08-05 NOTE — Anesthesia Postprocedure Evaluation (Signed)
Anesthesia Post Note  Patient: BAYLOR TEEGARDEN  Procedure(s) Performed: REPAIR OF MALUNION, LATERAL LIGAMENT RECONSTRUCTION, PARTIAL RESECTION OF FIBULA, FLEXOR TENOTOMY SECOND THROUGH FIFTH TOES WITH PINNING (Right: Foot)     Patient location during evaluation: PACU Anesthesia Type: Regional and General Level of consciousness: awake and alert, oriented and patient cooperative Pain management: pain level controlled Vital Signs Assessment: post-procedure vital signs reviewed and stable Respiratory status: spontaneous breathing, nonlabored ventilation and respiratory function stable Cardiovascular status: blood pressure returned to baseline and stable Postop Assessment: no apparent nausea or vomiting Anesthetic complications: no   No notable events documented.  Last Vitals:  Vitals:   08/05/21 1015 08/05/21 1045  BP: 118/61 115/72  Pulse: (!) 106 99  Resp: 13 20  Temp: (!) 36.1 C 36.6 C  SpO2: 95% 97%    Last Pain:  Vitals:   08/05/21 1045  TempSrc: Oral  PainSc: 0-No pain                 Lannie Fields

## 2021-08-05 NOTE — Evaluation (Signed)
Physical Therapy Evaluation and Discharge Patient Details Name: LARENDA REEDY MRN: [id number] DOB: February 17, 1996 Today's Date: 08/05/2021   History of Present Illness  Pt is a 25 y/o female s/p R foot hardware removal and internal fication of calcaneous malunion on 9/6. PMH includes previous ORIF of calcaneous fx and asthma.  Clinical Impression  Patient evaluated by Physical Therapy with no further acute PT needs identified. All education has been completed and the patient has no further questions. Pt overall at a mod I level with all mobility tasks this session. Used RW instead of knee scooter this session since RLE numb at knee and pt reports she did not feel comfortable using. Pt reports she feels comfortable with mobility tasks and will have necessary assist at home. See below for any follow-up Physical Therapy or equipment needs. PT is signing off. Thank you for this referral. If needs change, please re-consult.      Follow Up Recommendations Other (comment) (follow up with outpatient once cleared by MD)    Equipment Recommendations  None recommended by PT    Recommendations for Other Services       Precautions / Restrictions Precautions Precautions: Fall Restrictions Weight Bearing Restrictions: Yes RLE Weight Bearing: Non weight bearing      Mobility  Bed Mobility Overal bed mobility: Modified Independent                  Transfers Overall transfer level: Modified independent Equipment used: Rolling walker (2 wheeled)                Ambulation/Gait Ambulation/Gait assistance: Modified independent (Device/Increase time) Gait Distance (Feet): 100 Feet Assistive device: Rolling walker (2 wheeled) Gait Pattern/deviations: Step-to pattern Gait velocity: mildly decreased   General Gait Details: Hop to pattern with use of RW. No overt LOB noted. Attempted to practice with knee scooter, however, pt's knee very numb and did not feel safe  using.  Stairs Stairs:  (pt reports feeling comfortable with stair navigation.)          Wheelchair Mobility    Modified Rankin (Stroke Patients Only)       Balance Overall balance assessment: Mild deficits observed, not formally tested                                           Pertinent Vitals/Pain Pain Assessment: No/denies pain (numbness from knee  to toes)    Home Living Family/patient expects to be discharged to:: Private residence Living Arrangements: Spouse/significant other;Children Available Help at Discharge: Family Type of Home: House Home Access: Stairs to enter   Secretary/administrator of Steps: 2 Home Layout: One level Home Equipment: Environmental consultant - 2 wheels;Shower seat;Wheelchair - manual;Crutches;Other (comment) (knee scooter)      Prior Function Level of Independence: Independent               Hand Dominance        Extremity/Trunk Assessment   Upper Extremity Assessment Upper Extremity Assessment: Overall WFL for tasks assessed    Lower Extremity Assessment Lower Extremity Assessment: RLE deficits/detail RLE Deficits / Details: R ankle splinted and in ace wrap RLE Sensation: decreased light touch    Cervical / Trunk Assessment Cervical / Trunk Assessment: Normal  Communication   Communication: No difficulties  Cognition Arousal/Alertness: Awake/alert Behavior During Therapy: WFL for tasks assessed/performed Overall Cognitive Status: Within Functional Limits for tasks assessed  General Comments General comments (skin integrity, edema, etc.): Pt practiced lower and upper body dressing during session. Did not require any assist    Exercises     Assessment/Plan    PT Assessment Patent does not need any further PT services  PT Problem List         PT Treatment Interventions      PT Goals (Current goals can be found in the Care Plan section)  Acute Rehab  PT Goals Patient Stated Goal: to go home PT Goal Formulation: With patient Time For Goal Achievement: 08/05/21 Potential to Achieve Goals: Good    Frequency     Barriers to discharge        Co-evaluation               AM-PAC PT "6 Clicks" Mobility  Outcome Measure Help needed turning from your back to your side while in a flat bed without using bedrails?: None Help needed moving from lying on your back to sitting on the side of a flat bed without using bedrails?: None Help needed moving to and from a bed to a chair (including a wheelchair)?: None Help needed standing up from a chair using your arms (e.g., wheelchair or bedside chair)?: None Help needed to walk in hospital room?: None Help needed climbing 3-5 steps with a railing? : A Little 6 Click Score: 23    End of Session Equipment Utilized During Treatment: Gait belt Activity Tolerance: Patient tolerated treatment well Patient left: in bed;with call bell/phone within reach;with family/visitor present Nurse Communication: Mobility status PT Visit Diagnosis: Other abnormalities of gait and mobility (R26.89)    Time: 5956-3875 PT Time Calculation (min) (ACUTE ONLY): 16 min   Charges:   PT Evaluation $PT Eval Low Complexity: 1 Low          Cindee Salt, DPT  Acute Rehabilitation Services  Pager: [phone] Office: [phone]   Lehman Prom 08/05/2021, 12:42 PM

## 2021-08-05 NOTE — Anesthesia Preprocedure Evaluation (Addendum)
Anesthesia Evaluation  Patient identified by MRN, date of birth, ID band Patient awake    Reviewed: Allergy & Precautions, NPO status , Patient's Chart, lab work & pertinent test results  Airway Mallampati: I  TM Distance: >3 FB Neck ROM: Full    Dental no notable dental hx. (+) Teeth Intact, Dental Advisory Given   Pulmonary asthma (childhood) ,    Pulmonary exam normal breath sounds clear to auscultation       Cardiovascular negative cardio ROS Normal cardiovascular exam Rhythm:Regular Rate:Normal     Neuro/Psych  Headaches, PSYCHIATRIC DISORDERS Anxiety    GI/Hepatic negative GI ROS, Neg liver ROS,   Endo/Other  Obesity BMI 34  Renal/GU negative Renal ROS  negative genitourinary   Musculoskeletal Right calcaneus malunion, distal fibular avulsion, ankle instability   Abdominal   Peds  Hematology hct 40.1, plt 284   Anesthesia Other Findings   Reproductive/Obstetrics negative OB ROS                            Anesthesia Physical Anesthesia Plan  ASA: 2  Anesthesia Plan: General and Regional   Post-op Pain Management: GA combined w/ Regional for post-op pain   Induction: Intravenous  PONV Risk Score and Plan: 3 and Ondansetron, Dexamethasone, Midazolam and Treatment may vary due to age or medical condition  Airway Management Planned: LMA  Additional Equipment: None  Intra-op Plan:   Post-operative Plan: Extubation in OR  Informed Consent: I have reviewed the patients History and Physical, chart, labs and discussed the procedure including the risks, benefits and alternatives for the proposed anesthesia with the patient or authorized representative who has indicated his/her understanding and acceptance.     Dental advisory given  Plan Discussed with: CRNA  Anesthesia Plan Comments:         Anesthesia Quick Evaluation

## 2021-08-05 NOTE — Anesthesia Procedure Notes (Signed)
Procedure Name: LMA Insertion Date/Time: 08/05/2021 7:38 AM Performed by: Cy Blamer, CRNA Pre-anesthesia Checklist: Patient identified, Emergency Drugs available, Suction available and Patient being monitored Patient Re-evaluated:Patient Re-evaluated prior to induction Oxygen Delivery Method: Circle system utilized Preoxygenation: Pre-oxygenation with 100% oxygen Induction Type: IV induction Ventilation: Mask ventilation without difficulty LMA: LMA inserted LMA Size: 4.0 Tube type: Oral Number of attempts: 1 Placement Confirmation: ETT inserted through vocal cords under direct vision, positive ETCO2 and breath sounds checked- equal and bilateral Tube secured with: Tape Dental Injury: Teeth and Oropharynx as per pre-operative assessment

## 2021-08-05 NOTE — Discharge Instructions (Signed)
DR. Kilee Hedding [facility] ANKLE SURGERY POST-OP INSTRUCTIONS   Pain Management The numbing medicine and your leg will last around 18 hours, take a dose of your pain medicine as soon as you feel it wearing off to avoid rebound pain. Keep your foot elevated above heart level.  Make sure that your heel hangs free ('floats'). Take all prescribed medication as directed. If taking narcotic pain medication you may want to use an over-the-counter stool softener to avoid constipation. You may take over-the-counter NSAIDs (ibuprofen, naproxen, etc.) as well as over-the-counter acetaminophen as directed on the packaging as a supplement for your pain and may also use it to wean away from the prescription medication.  Activity Non-weightbearing Keep splint intact  First Postoperative Visit Your first postop visit will be at least 2 weeks after surgery.  This should be scheduled when you schedule surgery. If you do not have a postoperative visit scheduled please call [phone] to schedule an appointment. At the appointment your incision will be evaluated for suture removal, x-rays will be obtained if necessary.  General Instructions Swelling is very common after foot and ankle surgery.  It often takes 3 months for the foot and ankle to begin to feel comfortable.  Some amount of swelling will persist for 6-12 months. DO NOT change the dressing.  If there is a problem with the dressing (too tight, loose, gets wet, etc.) please contact Dr. Gari Trovato's office. DO NOT get the dressing wet.  For showers you can use an over-the-counter cast cover or wrap a washcloth around the top of your dressing and then cover it with a plastic bag and tape it to your leg. DO NOT soak the incision (no tubs, pools, bath, etc.) until you have approval from Dr. Nakai Pollio.  Contact Dr. Adairs office or go to Emergency Room if: Temperature above 101 F. Increasing pain that is unresponsive to pain medication or elevation Excessive redness or  swelling in your foot Dressing problems - excessive bloody drainage, looseness or tightness, or if dressing gets wet Develop pain, swelling, warmth, or discoloration of your calf  

## 2021-08-05 NOTE — Progress Notes (Signed)
OT Cancellation Note  Patient Details Name: Stephanie Dickson MRN: [id number] DOB: Apr 30, 1996   Cancelled Treatment:    Reason Eval/Treat Not Completed: OT screened, no needs identified, will sign off. Per PT, pt comfortable and safe with ADLs and functional transfers. No acute OT needs. Will sign off.   Emmilyn Crooke M Ryott Rafferty Annlouise Gerety MSOT, OTR/L Acute Rehab Pager: [phone] Office: [phone] 08/05/2021, 12:12 PM

## 2021-08-05 NOTE — Anesthesia Procedure Notes (Signed)
Anesthesia Regional Block: Popliteal block   Pre-Anesthetic Checklist: , timeout performed,  Correct Patient, Correct Site, Correct Laterality,  Correct Procedure, Correct Position, site marked,  Risks and benefits discussed,  Surgical consent,  Pre-op evaluation,  At surgeon's request and post-op pain management  Laterality: Right  Prep: Maximum Sterile Barrier Precautions used, chloraprep       Needles:  Injection technique: Single-shot  Needle Type: Echogenic Stimulator Needle     Needle Length: 9cm  Needle Gauge: 22     Additional Needles:   Procedures:,,,, ultrasound used (permanent image in chart),,    Narrative:  Start time: 08/05/2021 6:55 AM End time: 08/05/2021 7:00 AM Injection made incrementally with aspirations every 5 mL.  Performed by: Personally  Anesthesiologist: Lannie Fields, DO  Additional Notes: Monitors applied. No increased pain on injection. No increased resistance to injection. Injection made in 5cc increments. Good needle visualization. Patient tolerated procedure well.

## 2021-08-05 NOTE — Op Note (Addendum)
Stephanie Dickson female 25 y.o. 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, right 2 through 5 claw toes, right  PostOperative Diagnosis: Same   PROCEDURE: Deep orthopedic hardware removal, right foot Repair of calcaneus malunion with internal fixation Calcaneal osteotomy with lateral displacement Partial resection of distal fibula Lateral ligament reconstruction, secondary Partial resection of anterior process of the calcaneus Right second toe open flexor tenotomy Right third toe open flexor tenotomy Right fourth toe open flexor tenotomy Right fifth toe open flexor tenotomy  SURGEON: Dub Mikes, MD  ASSISTANT: None  ANESTHESIA: General LMA with peripheral nerve block  FINDINGS: Calcaneus malunion with retained orthopedic 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 female sustained a calcaneus fracture and open subtalar joint dislocation about 2 years ago.  She underwent open treatment with primary subtalar arthrodesis.  She had a malunion with varus deformity and lateral overload.  She also had ankle instability due to a nonunion of a distal fibular avulsion fracture.  She had evidence of bony fragmentation within the calcaneocuboid joint due to nonunion of an anterior process calcaneus fracture.  Given these findings she was indicated for the above surgery.  She failed conservative treatment the form of boot immobilization, formal physical therapy and activity modification.  She felt like her gait was off due to the malunion and had continued  pain laterally, anteriorly and medially about the foot and ankle.   Patient understood the risks, benefits and alternatives to surgery which include but are not limited to wound healing complications, infection, nonunion, malunion, need for further surgery as well as damage to surrounding structures. They also understood the potential for continued pain in that there were no guarantees of acceptable outcome After weighing these risks the patient opted to proceed with surgery.  PROCEDURE: Patient was identified in the preoperative holding area.  The right leg was marked by myself.  Consent was signed by myself and the patient.  Block was performed by anesthesia in the preoperative holding area.  Patient was taken to the operative suite and placed supine on the operative table.  General LMA anesthesia was induced without difficulty. Bump was placed under the operative hip and bone foam was used.  All bony prominences were well padded.  Tourniquet was placed on the operative thigh.  Preoperative antibiotics were given. The extremity was prepped and draped in the usual sterile fashion and surgical timeout was performed.  The limb was elevated and the tourniquet was inflated to 250 mmHg.  I began by making a small incision on the posterior aspect of the calcaneal tuberosity area.  Then a K wire was inserted into the first cannulated screw.  Hemostat was used to remove the soft tissue around the screw head.  The cannulated screwdriver was used over the wire to back out the screw uneventfully.  Separate incision was made in the posterior calcaneus near the first incision.  Wire was placed within the second screw.  The screwdriver was placed over top of the wire and the screw was removed uneventfully.  Adequate hardware removal was obtained and fluoroscopy confirmed complete hardware removal.  We then turned our attention to the lateral foot and ankle.  An incision was  made along the prior sinus Tarsi type incision  it was extended more posteriorly to gain access to the calcaneal tuberosity.  The incision was carried sharply down through skin and subcutaneous tissue.  Blunt dissection was used to mobilize skin flaps.  The dorsal aspect of the calcaneal tuberosity was identified and the incision was carried down to bone after blunt dissection did not visualize any branches of the sural nerve within the surgical field.  Then the plantar aspect of the calcaneal tuberosity was identified and Hohmann retractors were placed dorsally and plantarly about the calcaneus.  Periosteal elevator was used to mobilize soft tissues to allow for visualization of the calcaneus malunion.  The calcaneus malunion was identified.  It was within a varus position.  Then a sagittal saw was used to take down the malunited calcaneus fracture within the tuberosity portion.  The fracture was fully mobilized and the elevator was used to mobilize the malunited fracture fragments.  Then the calcaneus was reduced into a better position in a more valgus position.     There was continued varus of the calcaneus and therefore osteotomy was performed.  This was done through the tuberosity of the calcaneus after fluoroscopy confirmed appropriate position of the osteotomy.  Sagittal saw was used to perform a complete osteotomy of the tuberosity.  We were then able to put a key elevator through the osteotomy site to free up the soft tissues and to correct the [facility] into a more valgus position. We able to get approximately 6 to 7 mm of valgus correction through the calcaneus.  Then K wires were used to fix the fragments and osteotomy site provisionally.  Then a 7.0 mm headless compression screw was placed across the fracture fragment from posterior to anterior with care taken to ensure complete seating in the bone.  Then from the dorsal aspect of the talus a second K wire was placed across the subtalar joint and within the reduced fracture fragments.  A headed  compression screw was placed across this for fixation.  There is good position of the fracture fragments and valgus of the hindfoot after fixation with the screws.  We then turned our attention to the lateral malleolus.  A separate deep incision was made to gain access to the distal fibula at the site of the nonunion.  Care was taken to avoid cutting through the superior peroneal retinaculum.  The bony fragments were identified and sharp dissection was carried out to remove the bony fragments.  There was 2 separate avulsed pieces of the distal fibula at the attachment of the ATFL ligament.  The bony fragments were circumferentially dissected and removed and discarded.  The distal fibula was inspected and there was cortication of the distal aspect of the fibula which was then roughened up using a rondure to allow for exposed cancellous bony surfaces.  The resected bone was discarded.  After resection of the bone the ankle was stressed under direct visualization and found to be unstable with regard to anterior drawer with internal rotation moment.  The ankle was deemed unstable.  The ligamentous tissue was identified and removed from the distal aspect of the fibula completely.  Then the distal aspect of the fibula was identified at the origin of the ATFL and this area was used to place a suture tack through standard technique.  The suture tack was double loaded and the suture material was run through the lateral ligament complex and the extensor retinaculum and reconstructed back to bone using  a horizontal mattress suture technique.  The ankle was held in a slight plantarflexed position and everted.  Then the remaining ligament structure was oversewn with a 2-0 Vicryl suture.  There was good stability of the ankle after reconstruction of the ligaments.  We then carried the incision distally to the level of the calcaneocuboid joint and the nonunion of the anterior process of the calcaneus.  Deep dissection was  used to gain access to the anterior crest of the calcaneus and cuboid joint.  The joint was mobilized and under direct manipulation was found to have movement of the anterior process calcaneus fracture nonunion.  This was circumferentially dissected out and removed using a rondure.  Care was taken to completely resect the bony fragments in this area.    Afterward the wound was irrigated copiously with normal saline.  All of the wounds on the posterior heel as well as the dorsal foot and lateral foot were closed in a layered fashion using 3-0 Monocryl and 3-0 nylon suture.  We then turned our attention to the forefoot.  There is malleting of the toes and flexion contractures.  An incision was made along the plantar aspect of the second toe.  It was taken sharply down through skin and subcutaneous tissue and through the flexor digitorum longus tendon sheath.  The tendon was mobilized and using hemostat it was delivered through the wound.  Then a knife was used to perform a tenotomy of the flexor digitorum longus tendon to the second toe.  Afterwards the toe was more mobile and was able to be passively corrected to straight with the ankle in a dorsiflexed position.  We then turned our attention to the forefoot.  There is malleting of the toes and flexion contractures.  An incision was made along the plantar aspect of the third toe.  It was taken sharply down through skin and subcutaneous tissue and through the flexor digitorum longus tendon sheath.  The tendon was mobilized and using hemostat it was delivered through the wound.  Then a knife was used to perform a tenotomy of the flexor digitorum longus tendon.  Afterwards the toe was more mobile and was able to be passively corrected to straight with the ankle in a dorsiflexed position.  We then turned our attention to the forefoot.  There is malleting of the toes and flexion contractures.  An incision was made along the plantar aspect of the fourth toe.  It  was taken sharply down through skin and subcutaneous tissue and through the flexor digitorum longus tendon sheath.  The tendon was mobilized and using hemostat it was delivered through the wound.  Then a knife was used to perform a tenotomy of the flexor digitorum longus tendon.  Afterwards the toe was more mobile and was able to be passively corrected to straight with the ankle in a dorsiflexed position.  We then turned our attention to the forefoot.  There is malleting of the toes and flexion contractures.  An incision was made along the plantar aspect of the fifth toe.  It was taken sharply down through skin and subcutaneous tissue and through the flexor digitorum longus tendon sheath.  The tendon was mobilized and using hemostat it was delivered through the wound.  Then a knife was used to perform a tenotomy of the flexor digitorum longus tendon.  Afterwards the toe was more mobile and was able to be passively corrected to straight with the ankle in a dorsiflexed position.  The wounds on the  bottom of the toes were irrigated with saline and closed with a 3-0 nylon suture.  Soft dressings and a short leg splint were placed.  Tourniquet was released.  Tourniquet time approximately 75 minutes.  Counts were correct at the end of the case.  There are no complications.  She was awakened from anesthesia and taken recovery in stable condition.  POST OPERATIVE INSTRUCTIONS: Nonweightbearing to operative extremity She will be admitted for observation and discharged postop day 1 She will work with physical therapy Aspirin for DVT prophylaxis starting postoperative day 1 She will follow-up in 2 weeks for splint removal, suture removal if appropriate and nonweightbearing x-rays of the right foot and ankle Short leg cast placement at 2 weeks for 4 weeks.  TOURNIQUET TIME:75 minutes   BLOOD LOSS:  Minimal         DRAINS: none         SPECIMEN: none       COMPLICATIONS:  * No complications entered in  OR log *         Disposition: PACU - hemodynamically stable.         Condition: stable

## 2021-08-05 NOTE — H&P (Signed)
PREOPERATIVE H&P  Chief Complaint: Right foot and ankle pain  HPI: Stephanie Dickson is a 25 y.o. female who presents for preoperative history and physical with a diagnosis of prior calcaneal and subtalar fracture dislocation was open.  She underwent washout and fixation by Dr. Jena Gauss.  She had primary subtalar arthrodesis.  Patient has pain in her toes due to flexion contractures as well as pain along the lateral aspect of her foot and ankle consistent with instability and varus malunion of her calcaneal and subtalar joint fusion.  Patient also has symptomatic orthopedic hardware of the screw heads because difficulty with shoe wear in the posterior aspect of her calcaneus.  She is here today for surgical correction.  Symptoms are rated as moderate to severe, and have been worsening.  This is significantly impairing activities of daily living.  She has elected for surgical management.   Past Medical History:  Diagnosis Date   Anxiety    Asthma    as a child   Dysrhythmia    fast resting heart rate   Headache    High cholesterol    History of kidney stones    Medical history non-contributory    Past Surgical History:  Procedure Laterality Date   ADENOIDECTOMY     dermoid tumor resection     I & D EXTREMITY Right 01/26/2020   Procedure: IRRIGATION AND DEBRIDEMENT EXTREMITY APPLICATION OF WOUND VAC;  Surgeon: Roby Lofts, MD;  Location: [facility] OR;  Service: Orthopedics;  Laterality: Right;   open appendectomy     ORIF CALCANEOUS FRACTURE Right 01/26/2020   Procedure: ORIF  CALCANEUS;  Surgeon: Roby Lofts, MD;  Location: [facility] OR;  Service: Orthopedics;  Laterality: Right;   ORIF CALCANEOUS FRACTURE Right 03/27/2020   Procedure: SUBTALAR FUSION WITH RIA HARVEST;  Surgeon: Roby Lofts, MD;  Location: [facility] OR;  Service: Orthopedics;  Laterality: Right;   Social History   Socioeconomic History   Marital status: Married    Spouse name: Not on file   Number of children: Not on file    Years of education: Not on file   Highest education level: Not on file  Occupational History   Not on file  Tobacco Use   Smoking status: Never   Smokeless tobacco: Never  Vaping Use   Vaping Use: Every day   Substances: Nicotine  Substance and Sexual Activity   Alcohol use: Yes    Comment: occasional   Drug use: Not Currently    Types: Marijuana    Comment: occasionally   Sexual activity: Not on file  Other Topics Concern   Not on file  Social History Narrative   Not on file   Social Determinants of Health   Financial Resource Strain: Not on file  Food Insecurity: Not on file  Transportation Needs: Not on file  Physical Activity: Not on file  Stress: Not on file  Social Connections: Not on file   History reviewed. No pertinent family history. No Known Allergies Prior to Admission medications   Medication Sig Start Date End Date Taking? Authorizing Provider  clobetasol cream (TEMOVATE) 0.05 % Apply 1 application topically 2 (two) times daily. 07/09/21  Yes [provider]  Multiple Vitamin (MULTIVITAMIN WITH MINERALS) TABS tablet Take 1 tablet by mouth daily.   Yes [provider]  ascorbic acid (VITAMIN C) 500 MG tablet Take 1 tablet (500 mg total) by mouth daily. Patient not taking: Reported on 07/23/2021 01/30/20   Barnetta Chapel, PA-C  Cholecalciferol (VITAMIN D-3) 125 MCG (5000 UT) TABS Take 5,000 Units by mouth daily. Patient not taking: Reported on 07/23/2021 01/30/20   Despina Hidden, PA-C  gabapentin (NEURONTIN) 100 MG capsule Take 1 capsule (100 mg total) by mouth 3 (three) times daily. Patient not taking: Reported on 07/23/2021 03/29/20   Despina Hidden, PA-C  methocarbamol (ROBAXIN) 500 MG tablet Take 1 tablet (500 mg total) by mouth every 6 (six) hours as needed for muscle spasms. Patient not taking: Reported on 07/23/2021 03/29/20   Despina Hidden, PA-C  oxyCODONE-acetaminophen (PERCOCET) 5-325 MG tablet Take 1 tablet by mouth every 6 (six) hours  as needed for severe pain. Patient not taking: Reported on 07/23/2021 03/29/20   Despina Hidden, PA-C     Positive ROS: All other systems have been reviewed and were otherwise negative with the exception of those mentioned in the HPI and as above.  Physical Exam:  Vitals:   08/05/21 0720 08/05/21 0721  BP:    Pulse: 95 93  Resp:    Temp:    SpO2: 100% 100%   General: Alert, no acute distress Cardiovascular: No pedal edema Respiratory: No cyanosis, no use of accessory musculature GI: No organomegaly, abdomen is soft and non-tender Skin: No lesions in the area of chief complaint Neurologic: Sensation intact distally Psychiatric: Patient is competent for consent with normal mood and affect Lymphatic: No axillary or cervical lymphadenopathy  MUSCULOSKELETAL: Right foot demonstrates prominence of the lateral aspect of the foot and some varus to the hindfoot.  She has tenderness palpation and instability noted along the ankle joint laterally.  She has tenderness at the calcaneocuboid joint.  Her hindfoot is not passively correctable.  She has active ankle dorsiflexion, plantarflexion and inversion and eversion strength intact.  Sensation grossly intact distally.  Prior traumatic and surgical wounds are healed well.  Assessment: Right calcaneus malunion with varus deformity and ankle instability. Distal fibular avulsion nonunion Retained orthopedic hardware   Plan: Plan for hardware removal with lateral displacement calcaneal osteotomy.  As well as distal fibular resection due to nonunion and lateral ligament reconstruction.  She will require flexor tenotomy's of her 2 through 5 toes due to flexion contractures from prior swelling or injury.  Given the extent of her surgery she will be admitted to observation for overnight stay.  Likely discharge tomorrow..  We discussed the risks, benefits and alternatives of surgery which include but are not limited to wound healing complications,  infection, nonunion, malunion, need for further surgery, damage to surrounding structures and continued pain.  They understand there is no guarantees to an acceptable outcome.  After weighing these risks they opted to proceed with surgery.     Terance Hart, MD    08/05/2021 7:24 AM

## 2021-08-06 ENCOUNTER — Encounter (HOSPITAL_COMMUNITY): Payer: Self-pay | Admitting: Orthopaedic Surgery

## 2021-08-06 DIAGNOSIS — S92001A Unspecified fracture of right calcaneus, initial encounter for closed fracture: Secondary | ICD-10-CM | POA: Diagnosis not present

## 2021-08-06 MED ORDER — ASPIRIN 325 MG PO TABS: 325.0000 mg | ORAL_TABLET | Freq: Every day | ORAL | 0 refills | Status: AC

## 2021-08-06 MED ORDER — OXYCODONE HCL 5 MG PO TABS
5.0000 mg | ORAL_TABLET | ORAL | Status: DC
  Administered 2021-08-06: 5 mg via ORAL
  Filled 2021-08-06: qty 1

## 2021-08-06 MED ORDER — OXYCODONE HCL 5 MG PO TABS: 5.0000 mg | ORAL_TABLET | ORAL | 0 refills | Status: AC | PRN

## 2021-08-06 NOTE — Progress Notes (Signed)
Patient was transported via wheelchair by volunteer for discharge home; with complaints of mild pain on her right foot and was medicated before she went; compression dressing on her right leg was clean, dry and intact; room was checked and accounted for all her belongings; discharge instructions given to patient and her husband and both verbalized understanding on the instructions given.

## 2021-08-06 NOTE — Discharge Summary (Signed)
Patient ID: Stephanie Dickson MRN: [id number] DOB/AGE: 26-Mar-1996 27 y.o.  Admit date: 08/05/2021 Discharge date: 08/06/2021  Admission Diagnoses:  Active Problems:   Foot pain calcaneus malunion  Discharge Diagnoses:  Same  Past Medical History:  Diagnosis Date   Anxiety    Asthma    as a child   Dysrhythmia    fast resting heart rate   Headache    High cholesterol    History of kidney stones    Medical history non-contributory     Surgeries: Procedure(s): REPAIR OF MALUNION, LATERAL LIGAMENT RECONSTRUCTION, PARTIAL RESECTION OF FIBULA, FLEXOR TENOTOMY SECOND THROUGH FIFTH TOES WITH PINNING on 08/05/2021   Consultants:   Discharged Condition: Improved  Hospital Course: Stephanie Dickson is an 25 y.o. 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  Patient has severe unremitting pain that affects sleep, daily activities, and work/hobbies. After pre-op clearance the patient was taken to the operating room on 08/05/2021 and underwent  Procedure(s): REPAIR OF MALUNION, LATERAL LIGAMENT RECONSTRUCTION, PARTIAL RESECTION OF FIBULA, FLEXOR TENOTOMY SECOND THROUGH FIFTH TOES WITH PINNING.    She underwent surgery.  She was seen on postoperative day 1 and had done well with physical therapy.  The block had not worn off.  She was suitable for discharge as she mobilized well.  Exam: Right foot in a short leg splint.  Decreased sensation about the toes due to block.  No tenderness proximal to the splint.  He is awake and alert and in no acute distress.  Respirations are even and unlabored.  Patient was given perioperative antibiotics:  Anti-infectives (From admission, onward)    Start     Dose/Rate Route Frequency Ordered Stop   08/05/21 1400  ceFAZolin (ANCEF) IVPB 1 g/50 mL premix        1 g 100 mL/hr over 30 Minutes Intravenous Every 6 hours 08/05/21 1025 08/06/21 0200   08/05/21 0600   ceFAZolin (ANCEF) IVPB 2g/100 mL premix        2 g 200 mL/hr over 30 Minutes Intravenous On call to O.R. 08/05/21 0543 08/05/21 0740        Patient was given sequential compression devices, early ambulation, and chemoprophylaxis to prevent DVT.  Patient benefited maximally from hospital stay and there were no complications.    Recent vital signs: Patient Vitals for the past 24 hrs:  BP Temp Temp src Pulse Resp SpO2  08/06/21 0436 122/64 98.3 F (36.8 C) Oral 93 20 95 %  08/05/21 2320 121/67 98.4 F (36.9 C) Oral 86 20 95 %  08/05/21 1916 124/72 97.7 F (36.5 C) Oral 88 18 95 %  08/05/21 1539 105/64 98.4 F (36.9 C) Oral 81 18 95 %  08/05/21 1045 115/72 97.8 F (36.6 C) Oral 99 20 97 %  08/05/21 1015 118/61 (!) 97 F (36.1 C) -- (!) 106 13 95 %  08/05/21 1000 121/72 -- -- (!) 115 11 95 %  08/05/21 0945 117/73 97.9 F (36.6 C) -- (!) 119 15 99 %  08/05/21 0721 -- -- -- 93 -- 100 %  08/05/21 0720 -- -- -- 95 -- 100 %  08/05/21 0719 -- -- -- 94 -- 100 %  08/05/21 0718 -- -- -- 98 -- 100 %  08/05/21 0717 136/73 -- -- 96 -- 100 %  08/05/21 0716 -- -- -- 95 -- 100 %  08/05/21 0715 -- -- -- 100 -- 100 %  08/05/21 0714 -- -- -- 94 -- 100 %  08/05/21 0713 -- -- -- 95 -- 100 %  08/05/21 0712 137/65 -- -- 95 -- 100 %  08/05/21 0711 -- -- -- 100 -- 100 %  08/05/21 0710 -- -- -- (!) 106 -- 99 %  08/05/21 0709 -- -- -- 95 -- 99 %  08/05/21 0708 -- -- -- (!) 102 -- 99 %  08/05/21 0707 137/86 -- -- 93 -- 96 %     Recent laboratory studies: No results for input(s): WBC, HGB, HCT, PLT, NA, K, CL, CO2, BUN, CREATININE, GLUCOSE, INR, CALCIUM in the last 72 hours.  Invalid input(s): PT, 2   Discharge Medications:   Allergies as of 08/06/2021   No Known Allergies      Medication List     STOP taking these medications    oxyCODONE-acetaminophen 5-325 MG tablet Commonly known as: Percocet       TAKE these medications    ascorbic acid 500 MG tablet Commonly known as: VITAMIN  C Take 1 tablet (500 mg total) by mouth daily.   aspirin 325 MG tablet Commonly known as: Bayer Aspirin Take 1 tablet (325 mg total) by mouth daily.   clobetasol cream 0.05 % Commonly known as: TEMOVATE Apply 1 application topically 2 (two) times daily.   gabapentin 100 MG capsule Commonly known as: NEURONTIN Take 1 capsule (100 mg total) by mouth 3 (three) times daily.   methocarbamol 500 MG tablet Commonly known as: ROBAXIN Take 1 tablet (500 mg total) by mouth every 6 (six) hours as needed for muscle spasms.   multivitamin with minerals Tabs tablet Take 1 tablet by mouth daily.   oxyCODONE 5 MG immediate release tablet Commonly known as: Roxicodone Take 1 tablet (5 mg total) by mouth every 4 (four) hours as needed for up to 7 days.   Vitamin D-3 125 MCG (5000 UT) Tabs Take 5,000 Units by mouth daily.        Diagnostic Studies: DG Foot Complete Right  Result Date: 08/05/2021 CLINICAL DATA:  Removal and replacement of calcaneus fixation hardware. EXAM: RIGHT FOOT COMPLETE - 3+ VIEW; DG C-ARM 1-60 MIN-NO REPORT COMPARISON:  [facility] CT-09/26/2020 FLUOROSCOPY TIME:  22 seconds (0.3 mGy) FINDINGS: Three spot intraoperative fluoroscopic images of the right ankle are provided for review and demonstrate removal of pre-existing subtalar screws and placement of new fixation screws. There is now a posteriorly directed lag screw within anteriorly directed cancellous screw. There is an additional linear radiopaque foreign body about the posterior aspect the lower leg which is likely external to the patient. Expected subcutaneous emphysema about the operative site. No radiopaque foreign body. Joint spaces appear preserved given obliquity and technique. The ankle mortise appears preserved. IMPRESSION: Interval redo of subtalar screw fixation as above. Electronically Signed   By: Simonne Come M.D.   On: 08/05/2021 10:12   DG C-Arm 1-60 Min-No Report  Result Date: 08/05/2021 CLINICAL DATA:   Removal and replacement of calcaneus fixation hardware. EXAM: RIGHT FOOT COMPLETE - 3+ VIEW; DG C-ARM 1-60 MIN-NO REPORT COMPARISON:  [facility] CT-09/26/2020 FLUOROSCOPY TIME:  22 seconds (0.3 mGy) FINDINGS: Three spot intraoperative fluoroscopic images of the right ankle are provided for review and demonstrate removal of pre-existing subtalar screws and placement of new fixation screws. There is now a posteriorly directed lag screw within anteriorly directed cancellous screw. There is an additional linear radiopaque foreign body about the posterior aspect the lower leg which is likely external to the patient.  Expected subcutaneous emphysema about the operative site. No radiopaque foreign body. Joint spaces appear preserved given obliquity and technique. The ankle mortise appears preserved. IMPRESSION: Interval redo of subtalar screw fixation as above. Electronically Signed   By: Simonne Come M.D.   On: 08/05/2021 10:12    Disposition: Discharge disposition: 01-Home or Self Care       Discharge Instructions     Call MD / Call 911   Complete by: As directed    If you experience chest pain or shortness of breath, CALL 911 and be transported to the hospital emergency room.  If you develope a fever above 101 F, pus (white drainage) or increased drainage or redness at the wound, or calf pain, call your surgeon's office.   Constipation Prevention   Complete by: As directed    Drink plenty of fluids.  Prune juice may be helpful.  You may use a stool softener, such as Colace (over the counter) 100 mg twice a day.  Use MiraLax (over the counter) for constipation as needed.   Diet - low sodium heart healthy   Complete by: As directed    Increase activity slowly as tolerated   Complete by: As directed    NWB   Post-operative opioid taper instructions:   Complete by: As directed    POST-OPERATIVE OPIOID TAPER INSTRUCTIONS: It is important to wean off of your opioid medication as soon as possible. If you do  not need pain medication after your surgery it is ok to stop day one. Opioids include: Codeine, Hydrocodone(Norco, Vicodin), Oxycodone(Percocet, oxycontin) and hydromorphone amongst others.  Long term and even short term use of opiods can cause: Increased pain response Dependence Constipation Depression Respiratory depression And more.  Withdrawal symptoms can include Flu like symptoms Nausea, vomiting And more Techniques to manage these symptoms Hydrate well Eat regular healthy meals Stay active Use relaxation techniques(deep breathing, meditating, yoga) Do Not substitute Alcohol to help with tapering If you have been on opioids for less than two weeks and do not have pain than it is ok to stop all together.  Plan to wean off of opioids This plan should start within one week post op of your joint replacement. Maintain the same interval or time between taking each dose and first decrease the dose.  Cut the total daily intake of opioids by one tablet each day Next start to increase the time between doses. The last dose that should be eliminated is the evening dose.           Follow-up Information     Terance Hart, MD Follow up in 2 week(s).   Specialty: Orthopedic Surgery Contact information: [address] [place] [address] [phone]                  Signed: Terance Hart 08/06/2021, 6:28 AM

## 2022-03-12 IMAGING — RF DG FOOT COMPLETE 3+V*R*
1 series · 3 of 3 positions shown · non-contrast
Comparison: Ankle CT-09/26/2020

FLUOROSCOPY TIME:  22 seconds (0.3 mGy)

CLINICAL DATA: Removal and replacement of calcaneus fixation
hardware.

EXAM:
RIGHT FOOT COMPLETE - 3+ VIEW; DG C-ARM 1-60 MIN-NO REPORT

[Series 1: run · 3 of 3 slices shown]
[im 1/3]
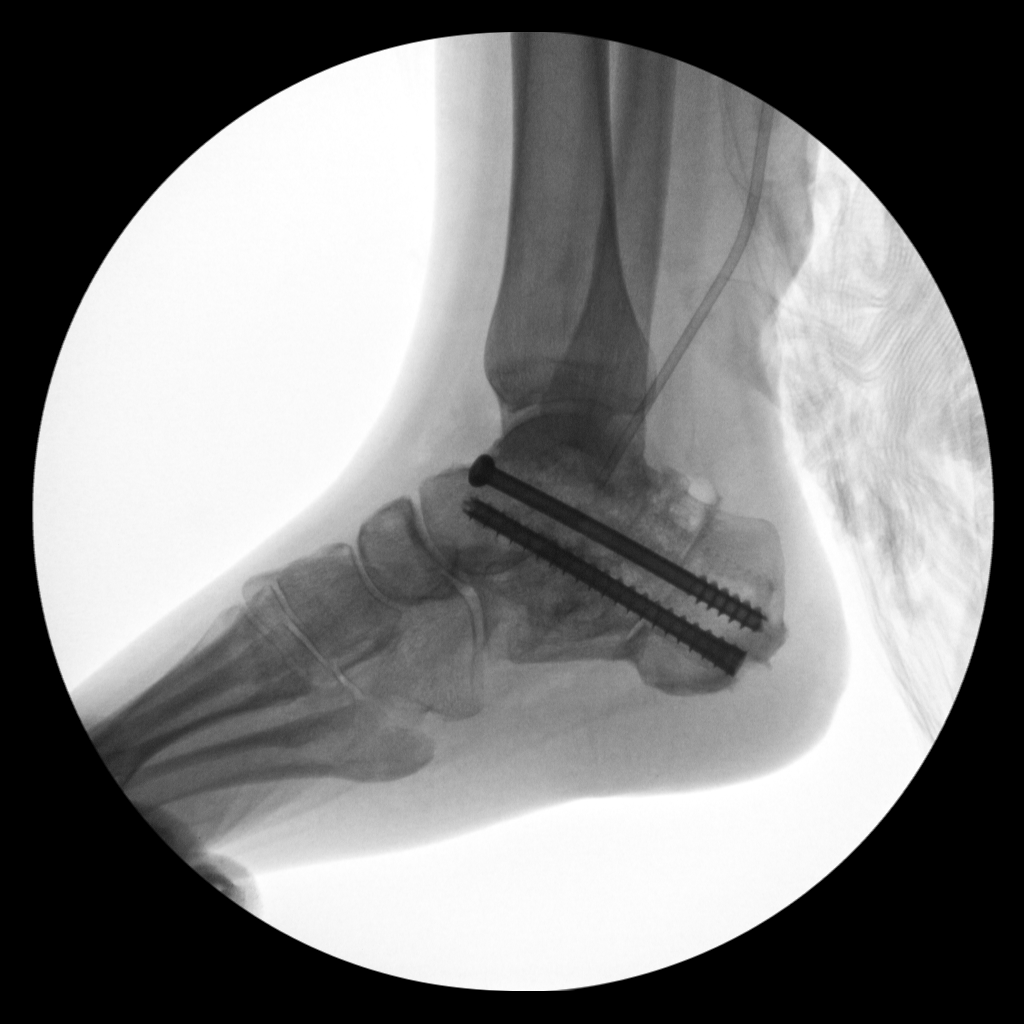
[im 2/3]
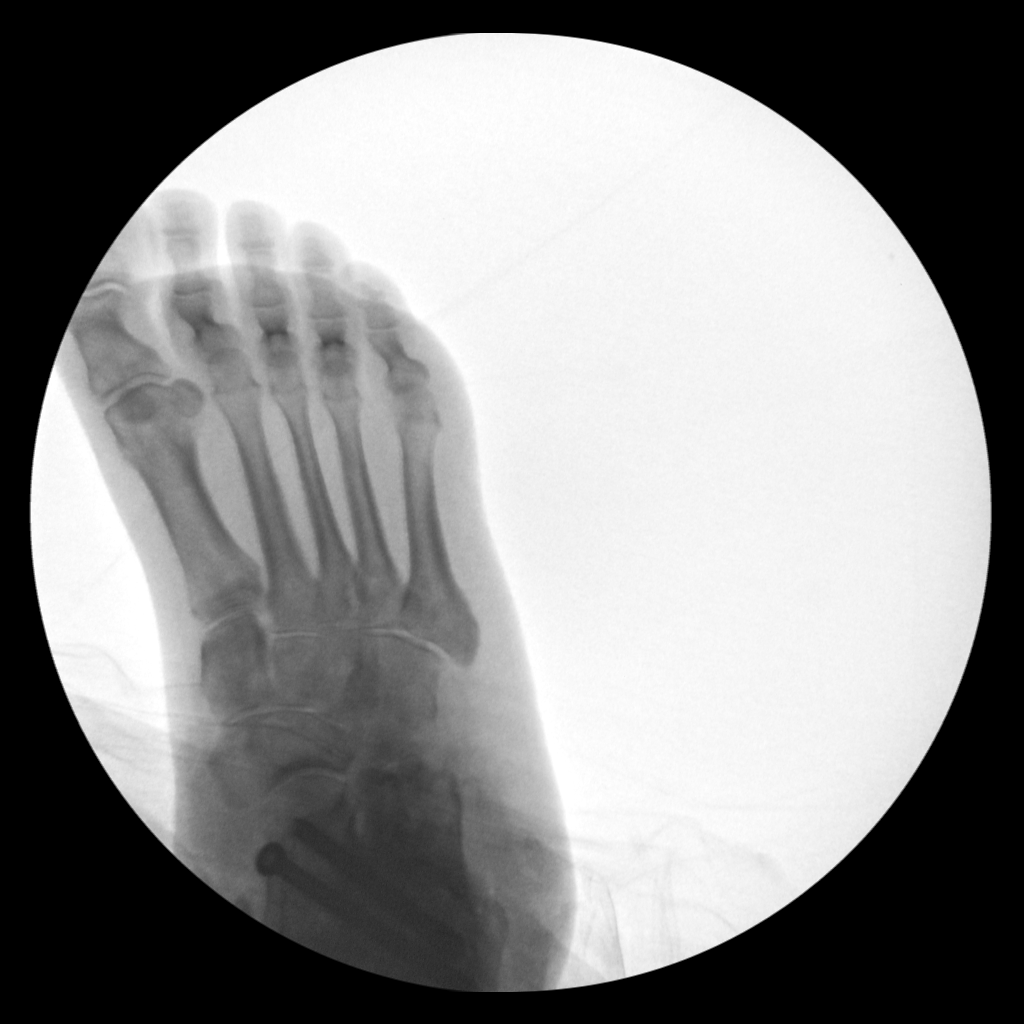
[im 3/3]
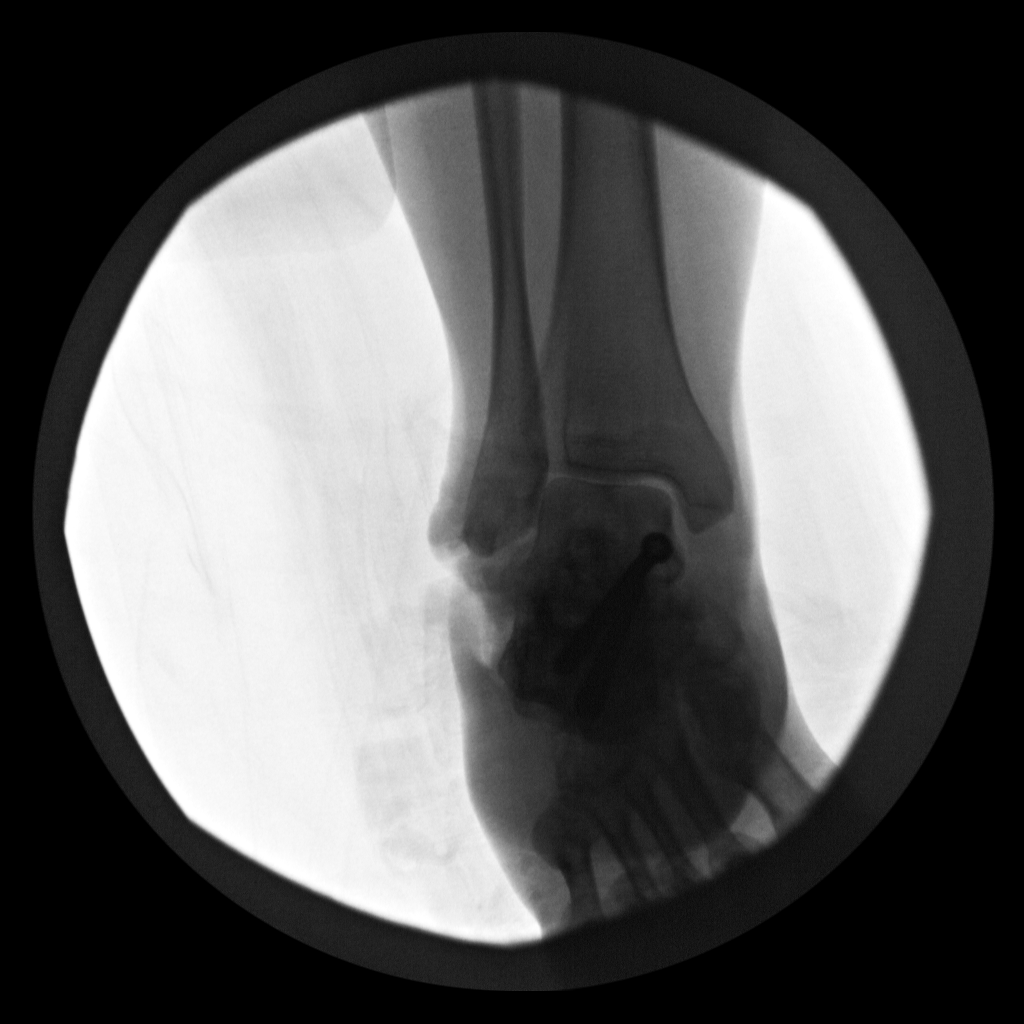

[3 of 3 positions shown; findings below may reference images not displayed]

FINDINGS: Three spot intraoperative fluoroscopic images of the right ankle are
provided for review and demonstrate removal of pre-existing subtalar
screws and placement of new fixation screws. There is now a
posteriorly directed lag screw within anteriorly directed cancellous
screw.

There is an additional linear radiopaque foreign body about the
posterior aspect the lower leg which is likely external to the
patient. Expected subcutaneous emphysema about the operative site.
No radiopaque foreign body.

Joint spaces appear preserved given obliquity and technique. The
ankle mortise appears preserved.
IMPRESSION: Interval redo of subtalar screw fixation as above.

## 2023-04-14 ENCOUNTER — Other Ambulatory Visit: Payer: Self-pay | Admitting: Orthopaedic Surgery

## 2023-04-14 DIAGNOSIS — M79671 Pain in right foot: Secondary | ICD-10-CM
